# Patient Record
Sex: Female | Born: 1950 | Race: Black or African American | Hispanic: No | Marital: Married | Smoking: Never smoker
Health system: Southern US, Community
[De-identification: ages and names within clinical notes are randomized; demographics above are authoritative.]

## PROBLEM LIST (undated history)

## (undated) DIAGNOSIS — I10 Essential (primary) hypertension: Secondary | ICD-10-CM

## (undated) DIAGNOSIS — R51 Headache: Secondary | ICD-10-CM

## (undated) DIAGNOSIS — T783XXA Angioneurotic edema, initial encounter: Secondary | ICD-10-CM

## (undated) DIAGNOSIS — L509 Urticaria, unspecified: Secondary | ICD-10-CM

## (undated) DIAGNOSIS — K219 Gastro-esophageal reflux disease without esophagitis: Secondary | ICD-10-CM

## (undated) DIAGNOSIS — G971 Other reaction to spinal and lumbar puncture: Secondary | ICD-10-CM

## (undated) DIAGNOSIS — N189 Chronic kidney disease, unspecified: Secondary | ICD-10-CM

## (undated) HISTORY — PX: OTHER SURGICAL HISTORY: SHX169

## (undated) HISTORY — PX: TUBAL LIGATION: SHX77

## (undated) HISTORY — PX: DILATION AND CURETTAGE OF UTERUS: SHX78

## (undated) HISTORY — DX: Urticaria, unspecified: L50.9

## (undated) HISTORY — PX: TONSILLECTOMY: SUR1361

## (undated) HISTORY — DX: Essential (primary) hypertension: I10

## (undated) HISTORY — PX: BREAST SURGERY: SHX581

## (undated) HISTORY — DX: Angioneurotic edema, initial encounter: T78.3XXA

---

## 1998-07-23 ENCOUNTER — Other Ambulatory Visit: Admission: RE | Admit: 1998-07-23 | Discharge: 1998-07-23 | Payer: Self-pay | Admitting: Gynecology

## 2000-03-31 ENCOUNTER — Other Ambulatory Visit: Admission: RE | Admit: 2000-03-31 | Discharge: 2000-03-31 | Payer: Self-pay | Admitting: Gynecology

## 2001-08-12 ENCOUNTER — Other Ambulatory Visit: Admission: RE | Admit: 2001-08-12 | Discharge: 2001-08-12 | Payer: Self-pay | Admitting: Gynecology

## 2002-11-26 ENCOUNTER — Other Ambulatory Visit: Admission: RE | Admit: 2002-11-26 | Discharge: 2002-11-26 | Payer: Self-pay | Admitting: Gynecology

## 2003-04-29 ENCOUNTER — Encounter: Payer: Self-pay | Admitting: Emergency Medicine

## 2003-04-29 ENCOUNTER — Emergency Department (HOSPITAL_COMMUNITY): Admission: EM | Admit: 2003-04-29 | Discharge: 2003-04-29 | Payer: Self-pay | Admitting: Emergency Medicine

## 2004-06-23 ENCOUNTER — Encounter: Admission: RE | Admit: 2004-06-23 | Discharge: 2004-06-23 | Payer: Self-pay | Admitting: Obstetrics and Gynecology

## 2005-05-31 ENCOUNTER — Other Ambulatory Visit: Admission: RE | Admit: 2005-05-31 | Discharge: 2005-05-31 | Payer: Self-pay | Admitting: Gynecology

## 2006-04-13 ENCOUNTER — Encounter: Admission: RE | Admit: 2006-04-13 | Discharge: 2006-04-13 | Payer: Self-pay | Admitting: Gynecology

## 2007-05-25 ENCOUNTER — Ambulatory Visit (HOSPITAL_COMMUNITY): Admission: RE | Admit: 2007-05-25 | Discharge: 2007-05-25 | Payer: Self-pay | Admitting: Gynecology

## 2008-01-11 ENCOUNTER — Ambulatory Visit (HOSPITAL_COMMUNITY): Admission: RE | Admit: 2008-01-11 | Discharge: 2008-01-11 | Payer: Self-pay | Admitting: Family Medicine

## 2008-01-18 ENCOUNTER — Ambulatory Visit (HOSPITAL_COMMUNITY): Admission: RE | Admit: 2008-01-18 | Discharge: 2008-01-18 | Payer: Self-pay | Admitting: Gastroenterology

## 2008-01-18 ENCOUNTER — Ambulatory Visit: Payer: Self-pay | Admitting: Gastroenterology

## 2008-01-18 HISTORY — PX: COLONOSCOPY: SHX174

## 2008-03-04 ENCOUNTER — Ambulatory Visit: Payer: Self-pay | Admitting: Gastroenterology

## 2011-03-02 NOTE — Op Note (Signed)
Beth Mayo, Beth Mayo                  ACCOUNT NO.:  [id number]   MEDICAL RECORD NO.:  [id number]          PATIENT TYPE:  AMB   LOCATION:  DAY                           FACILITY:  [facility]   PHYSICIAN:  Kassie Mends, M.D.      DATE OF BIRTH:  1951-07-21   DATE OF PROCEDURE:  DATE OF DISCHARGE:                               OPERATIVE REPORT   REFERRING PHYSICIAN:  Scott A. Gerda Diss, MD   PROCEDURE:  Colonoscopy.   INDICATION FOR EXAMINATION:  Beth Mayo is a 60 year old female who  reports a change in her bowel habits.  She denies any rectal bleeding or  black tarry stools.  She is not having any abdominal pain.   FINDINGS:  Moderate internal hemorrhoids.  Otherwise no polyps, masses,  inflammatory changes, diverticular  AVMs.   RECOMMENDATIONS:  1. She should drink 6-8 cups of water daily and add Benefiber twice      daily.  She should follow a high fiber diet.  2. And MiraLax once daily.  3. She was given a handout on high-fiber diet and hemorrhoids.      Screening colonoscopy in 10 years.  4. Follow-up appointment in 4 weeks with Dr. Cira Servant regarding her      constipation.  5. Will check a TSH today.   MEDICATIONS:  1. Demerol 75 mg IV.  2. Versed 6 mg IV.   PROCEDURE TECHNIQUE:  Physical exam was performed.  Informed consent was  obtained from the patient after explaining the benefits, risks and  alternatives to the procedure.  The patient was connected to the monitor  and placed in the left lateral position.  Continuous oxygen was provided  by nasal cannula and IV medicine administered through  an indwelling  cannula.  After administration of sedation and rectal exam, the  patient's rectum was intubated  and the scope was advanced under direct visualization to the cecum.  The  scope was removed slowly by carefully examining the color, texture,  anatomy and integrity of the mucosa on the way out.  The patient was  recovered in endoscopy and discharged home in satisfactory  condition.      Kassie Mends, M.D.  Electronically Signed     SM/MEDQ  D:  01/18/2008  T:  01/18/2008  Job:  [id number]   cc:   Lorin Picket A. Gerda Diss, MD  Fax: [phone]

## 2011-03-02 NOTE — Assessment & Plan Note (Signed)
NAMELESSLY, STIGLER                   CHART#:  [id number]   DATE:  03/04/2008                       DOB:  [birth year]   PROBLEM LIST:  1. Constipation.  2. Hypertension.   SUBJECTIVE:  Ms. Daniely is a 60 year old female who was initially seen  for a screening colonoscopy.  She was complaining of change in her bowel  habits, and the colonoscopy revealed moderate internal hemorrhoids.  Since her visit in April 2009 she has added water, fiber, and MiraLax to  her regimen.  Her bowel movements are better.  She is now having 3-4 a  week.  Initially she had some straining, but now that is better.  She is  not seeing any blood.  She does not have any abdominal pain, heartburn  or indigestion.  She does have questions in regards to weight loss.   MEDICATIONS:  1. Verapamil.  2. Triamterene/HCTZ.  3. Benefiber usually daily.  4. MiraLax usually daily.   OBJECTIVE/PHYSICAL EXAMINATION:  VITAL SIGNS:  Weight 214 pounds, height  5 feet 4 inches, temperature 98.7, blood pressure 130/78, pulse 72.  GENERAL:  She is in no apparent distress.  Alert and oriented x4.  LUNGS:  Clear to auscultation bilaterally.  CARDIOVASCULAR:  Regular  rhythm.  No murmur.ABDOMEN:  Bowel sounds are present, soft, nontender,  nondistended.   ASSESSMENT:  Ms. Coltrane is a 60 year old female with constipation which  has responded to MiraLax, fiber, and water.  Thank you for allowing me  to see Ms. Macknight in consultation.   RECOMMENDATIONS:  1. I gave her a handout on low fat diet and if she continues to      exercise and has portion control and calorie control, then she      should be able to lose weight.  2. She should continue the water, fiber, and MiraLax.  3. She may follow with me as needed for her constipation.       Kassie Mends, M.D.  Electronically Signed     SM/MEDQ  D:  03/04/2008  T:  03/04/2008  Job:  [id number]   cc:   Lorin Picket A. Gerda Diss, MD

## 2011-07-13 LAB — TSH: TSH: 1.162

## 2012-11-28 ENCOUNTER — Telehealth: Payer: Self-pay | Admitting: Gastroenterology

## 2012-11-28 ENCOUNTER — Other Ambulatory Visit (HOSPITAL_COMMUNITY): Payer: Self-pay | Admitting: Family Medicine

## 2012-11-28 ENCOUNTER — Ambulatory Visit (HOSPITAL_COMMUNITY)
Admission: RE | Admit: 2012-11-28 | Discharge: 2012-11-28 | Disposition: A | Discharge status: Home / Self Care | Payer: BC Managed Care – PPO | Source: Ambulatory Visit | Attending: Family Medicine | Admitting: Family Medicine

## 2012-11-28 DIAGNOSIS — R109 Unspecified abdominal pain: Secondary | ICD-10-CM | POA: Insufficient documentation

## 2012-11-28 MED ORDER — IOHEXOL 300 MG/ML  SOLN
80.0000 mL | Freq: Once | INTRAMUSCULAR | Status: AC | PRN
  Administered 2012-11-28: 80 mL via INTRAVENOUS

## 2012-11-28 NOTE — Telephone Encounter (Signed)
Received call from Dr. Gerda Diss regarding patient. Epigastric pain X 3 days, known to our practice from the remote past.  SLF patient. CT negative, thus far labs negative.  I spoke with the patient, and she is able to see me tomorrow, 2/12 at 10 am.  Please arrange the appointment in epic, and can we request records from her PCP for her visit tomorrow? I know it is short notice. Thanks!

## 2012-11-29 ENCOUNTER — Ambulatory Visit (INDEPENDENT_AMBULATORY_CARE_PROVIDER_SITE_OTHER): Payer: BC Managed Care – PPO | Admitting: Gastroenterology

## 2012-11-29 ENCOUNTER — Encounter: Payer: Self-pay | Admitting: Gastroenterology

## 2012-11-29 VITALS — BP 137/76 | HR 74 | Temp 98.4°F | Ht 64.0 in | Wt 220.2 lb

## 2012-11-29 DIAGNOSIS — K3189 Other diseases of stomach and duodenum: Secondary | ICD-10-CM

## 2012-11-29 DIAGNOSIS — R1013 Epigastric pain: Secondary | ICD-10-CM

## 2012-11-29 NOTE — Patient Instructions (Addendum)
Continue taking Protonix each morning, 30 minutes before breakfast.  We have set you up for an upper endoscopy with Dr. Darrick Penna in the near future.  STOP BC powders :)   Please review the low-fat diet. This is good to follow due to the history of a "fatty liver".   Fat and Cholesterol Control Diet Cholesterol levels in your body are determined significantly by your diet. Cholesterol levels may also be related to heart disease. The following material helps to explain this relationship and discusses what you can do to help keep your heart healthy. Not all cholesterol is bad. Low-density lipoprotein (LDL) cholesterol is the "bad" cholesterol. It may cause fatty deposits to build up inside your arteries. High-density lipoprotein (HDL) cholesterol is "good." It helps to remove the "bad" LDL cholesterol from your blood. Cholesterol is a very important risk factor for heart disease. Other risk factors are high blood pressure, smoking, stress, heredity, and weight. The heart muscle gets its supply of blood through the coronary arteries. If your LDL cholesterol is high and your HDL cholesterol is low, you are at risk for having fatty deposits build up in your coronary arteries. This leaves less room through which blood can flow. Without sufficient blood and oxygen, the heart muscle cannot function properly and you may feel chest pains (angina pectoris). When a coronary artery closes up entirely, a part of the heart muscle may die causing a heart attack (myocardial infarction). CHECKING CHOLESTEROL When your caregiver sends your blood to a lab to be examined for cholesterol, a complete lipid (fat) profile may be done. With this test, the total amount of cholesterol and levels of LDL and HDL are determined. Triglycerides are a type of fat that circulates in the blood. They can also be used to determine heart disease risk. The list below describes what the numbers should be: Test: Total Cholesterol.  Less than 200  mg/dl. Test: LDL "bad cholesterol."  Less than 100 mg/dl.  Less than 70 mg/dl if you are at very high risk of a heart attack or sudden cardiac death. Test: HDL "good cholesterol."  Greater than 50 mg/dl for women.  Greater than 40 mg/dl for men. Test: Triglycerides.  Less than 150 mg/dl. CONTROLLING CHOLESTEROL WITH DIET Although exercise and lifestyle factors are important, your diet is key. That is because certain foods are known to raise cholesterol and others to lower it. The goal is to balance foods for their effect on cholesterol and more importantly, to replace saturated and trans fat with other types of fat, such as monounsaturated fat, polyunsaturated fat, and omega-3 fatty acids. On average, a person should consume no more than 15 to 17 g of saturated fat daily. Saturated and trans fats are considered "bad" fats, and they will raise LDL cholesterol. Saturated fats are primarily found in animal products such as meats, butter, and cream. However, that does not mean you need to give up all your favorite foods. Today, there are good tasting, low-fat, low-cholesterol substitutes for most of the things you like to eat. Choose low-fat or nonfat alternatives. Choose round or loin cuts of red meat. These types of cuts are lowest in fat and cholesterol. Chicken (without the skin), fish, veal, and ground [place] breast are great choices. Eliminate fatty meats, such as hot dogs and salami. Even shellfish have little or no saturated fat. Have a 3 oz (85 g) portion when you eat lean meat, poultry, or fish. Trans fats are also called "partially hydrogenated oils." They are  oils that have been scientifically manipulated so that they are solid at room temperature resulting in a longer shelf life and improved taste and texture of foods in which they are added. Trans fats are found in stick margarine, some tub margarines, cookies, crackers, and baked goods.  When baking and cooking, oils are a great  substitute for butter. The monounsaturated oils are especially beneficial since it is believed they lower LDL and raise HDL. The oils you should avoid entirely are saturated tropical oils, such as coconut and palm.  Remember to eat a lot from food groups that are naturally free of saturated and trans fat, including fish, fruit, vegetables, beans, grains (barley, rice, couscous, bulgur wheat), and pasta (without cream sauces).  IDENTIFYING FOODS THAT LOWER CHOLESTEROL  Soluble fiber may lower your cholesterol. This type of fiber is found in fruits such as apples, vegetables such as broccoli, potatoes, and carrots, legumes such as beans, peas, and lentils, and grains such as barley. Foods fortified with plant sterols (phytosterol) may also lower cholesterol. You should eat at least 2 g per day of these foods for a cholesterol lowering effect.  Read package labels to identify low-saturated fats, trans fat free, and low-fat foods at the supermarket. Select cheeses that have only 2 to 3 g saturated fat per ounce. Use a heart-healthy tub margarine that is free of trans fats or partially hydrogenated oil. When buying baked goods (cookies, crackers), avoid partially hydrogenated oils. Breads and muffins should be made from whole grains (whole-wheat or whole oat flour, instead of "flour" or "enriched flour"). Buy non-creamy canned soups with reduced salt and no added fats.  FOOD PREPARATION TECHNIQUES  Never deep-fry. If you must fry, either stir-fry, which uses very little fat, or use non-stick cooking sprays. When possible, broil, bake, or roast meats, and steam vegetables. Instead of putting butter or margarine on vegetables, use lemon and herbs, applesauce, and cinnamon (for squash and sweet potatoes), nonfat yogurt, salsa, and low-fat dressings for salads.  LOW-SATURATED FAT / LOW-FAT FOOD SUBSTITUTES Meats / Saturated Fat (g)  Avoid: Steak, marbled (3 oz/85 g) / 11 g  Choose: Steak, lean (3 oz/85 g) / 4  g  Avoid: Hamburger (3 oz/85 g) / 7 g  Choose: Hamburger, lean (3 oz/85 g) / 5 g  Avoid: Ham (3 oz/85 g) / 6 g  Choose: Ham, lean cut (3 oz/85 g) / 2.4 g  Avoid: Chicken, with skin, dark meat (3 oz/85 g) / 4 g  Choose: Chicken, skin removed, dark meat (3 oz/85 g) / 2 g  Avoid: Chicken, with skin, light meat (3 oz/85 g) / 2.5 g  Choose: Chicken, skin removed, light meat (3 oz/85 g) / 1 g Dairy / Saturated Fat (g)  Avoid: Whole milk (1 cup) / 5 g  Choose: Low-fat milk, 2% (1 cup) / 3 g  Choose: Low-fat milk, 1% (1 cup) / 1.5 g  Choose: Skim milk (1 cup) / 0.3 g  Avoid: Hard cheese (1 oz/28 g) / 6 g  Choose: Skim milk cheese (1 oz/28 g) / 2 to 3 g  Avoid: Cottage cheese, 4% fat (1 cup) / 6.5 g  Choose: Low-fat cottage cheese, 1% fat (1 cup) / 1.5 g  Avoid: Ice cream (1 cup) / 9 g  Choose: Sherbet (1 cup) / 2.5 g  Choose: Nonfat frozen yogurt (1 cup) / 0.3 g  Choose: Frozen fruit bar / trace  Avoid: Whipped cream (1 tbs) / 3.5 g  Choose:  Nondairy whipped topping (1 tbs) / 1 g Condiments / Saturated Fat (g)  Avoid: Mayonnaise (1 tbs) / 2 g  Choose: Low-fat mayonnaise (1 tbs) / 1 g  Avoid: Butter (1 tbs) / 7 g  Choose: Extra light margarine (1 tbs) / 1 g  Avoid: Coconut oil (1 tbs) / 11.8 g  Choose: Olive oil (1 tbs) / 1.8 g  Choose: Corn oil (1 tbs) / 1.7 g  Choose: Safflower oil (1 tbs) / 1.2 g  Choose: Sunflower oil (1 tbs) / 1.4 g  Choose: Soybean oil (1 tbs) / 2.4 g  Choose: Canola oil (1 tbs) / 1 g Document Released: 10/04/2005 Document Revised: 12/27/2011 Document Reviewed: 03/25/2011 [facility] Patient Information 2013 [place], [place].

## 2012-11-29 NOTE — Progress Notes (Signed)
Referring Provider: Babs Sciara, MD Primary Care Physician:  Lilyan Punt, MD Primary Gastroenterologist:  Dr. Darrick Penna   Chief Complaint  Patient presents with  . Abdominal Pain    HPI:   [name] is a pleasant 62 year old female who presents today as an urgent office visit at the request of Dr. Lilyan Punt, secondary to new onset dyspepsia. Labs including LFTs, CBC, BMP, amylase, and lipase have all been normal. CT performed with hepatic steatosis, otherwise normal.  Notes acute onset of epigastric pain last Saturday. Noted as constant, sometimes worse than others. Not worsened by eating/drinking. Started Protonix yesterday and noted some relief. Taking Mylanta prn. No N/V. Denies reflux. Denies dysphagia. No wt loss or lack of appetite. Denies melena. Ibuprofen rarely. +BC powders, routinely. Scant hematochezia with constipation. Has taken probiotics in the past with some improvement in constipation. No probiotics since Saturday. Last colonoscopy in April 2009 overall normal by Dr. Darrick Penna, repeat in 10 years.    Past Medical History  Diagnosis Date  . Hypertension     Past Surgical History  Procedure Laterality Date  . Colonoscopy  01/18/2008      SLF: Moderate internal hemorrhoids.Otherwise no polyps, masses inflammatory changes, diverticular  AVMs.  . Cesarean section      X2  . Tonsillectomy      Current Outpatient Prescriptions  Medication Sig Dispense Refill  . pantoprazole (PROTONIX) 40 MG tablet Take 40 mg by mouth daily.       Marland Kitchen triamterene-hydrochlorothiazide (DYAZIDE) 37.5-25 MG per capsule Take 1 capsule by mouth daily.       . verapamil (CALAN-SR) 240 MG CR tablet Take 240 mg by mouth at bedtime.        No current facility-administered medications for this visit.    Allergies as of 11/29/2012 - Review Complete 11/29/2012  Allergen Reaction Noted  . Codeine Other (See Comments) 11/29/2012    Family History  Problem Relation Age of Onset  . Colon cancer  Neg Hx     History   Social History  . Marital Status: Married    Spouse Name: N/A    Number of Children: N/A  . Years of Education: N/A   Occupational History  . 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 Smoking status: Never Smoker   . Smokeless tobacco: Not on file  . Alcohol Use: Yes     Comment: socially  . Drug Use: No  . Sexually Active: Not on file   Other Topics Concern  . Not on file   Social History Narrative  . No narrative on file    Review of Systems: Gen: Denies any fever, chills, loss of appetite, fatigue, weight loss. CV: Denies chest pain, heart palpitations, syncope, peripheral edema. Resp: Denies shortness of breath with rest, cough, wheezing GI: SEE HPI GU : Denies urinary burning, urinary frequency, urinary incontinence.  MS: +back pain Derm: Denies rash, itching, dry skin Psych: Denies depression, anxiety, confusion or memory loss  Heme: Denies bruising, bleeding, and enlarged lymph nodes.  Physical Exam: BP 137/76  Pulse 74  Temp(Src) 98.4 F (36.9 C) (Oral)  Ht 5\' 4"  (1.626 m)  Wt 220 lb 3.2 oz (99.882 kg)  BMI 37.78 kg/m2 General:   Alert and oriented. Well-developed, well-nourished, pleasant and cooperative. Head:  Normocephalic and atraumatic. Eyes:  Conjunctiva pink, sclera clear, no icterus.   Conjunctiva pink. Ears:  Normal auditory acuity. Nose:  No deformity, discharge,  or lesions. Mouth:  No deformity or lesions, mucosa pink and moist.  Neck:  Supple, without mass or thyromegaly. Lungs:  Clear to auscultation bilaterally, without wheezing, rales, or rhonchi.  Heart:  S1, S2 present without murmurs noted.  Abdomen:  +BS, soft, TTP epigastric region and non-distended. Without mass or HSM. No rebound or guarding. No hernias noted. Rectal:  Deferred  Msk:  Symmetrical without gross deformities. Normal posture. Extremities:  Without clubbing or edema. Neurologic:  Alert and  oriented x4;   grossly normal neurologically. Skin:  Intact, warm and dry without significant lesions or rashes Cervical Nodes:  No significant cervical adenopathy. Psych:  Alert and cooperative. Normal mood and affect.

## 2012-11-29 NOTE — Telephone Encounter (Signed)
All set!

## 2012-11-30 DIAGNOSIS — R1013 Epigastric pain: Secondary | ICD-10-CM | POA: Insufficient documentation

## 2012-11-30 NOTE — Assessment & Plan Note (Signed)
62 year old female with new-onset epigastric pain in the setting of daily BC powders. She denies any melena, and CBC, BMP, HFP, lipase, amylase, and CT are all normal. Incidental finding of fatty liver noted. She has noted mild improvement with the introduction of Protonix yesterday. Likely dealing with gastritis, PUD secondary to [facility] powders. Stop all NSAIDs, aspirin powders. Continue Protonix.  Proceed with upper endoscopy in the near future with Dr. Darrick Penna. The risks, benefits, and alternatives have been discussed in detail with patient. They have stated understanding and desire to proceed.

## 2012-12-01 ENCOUNTER — Ambulatory Visit (HOSPITAL_COMMUNITY): Payer: Self-pay

## 2012-12-04 ENCOUNTER — Encounter (HOSPITAL_COMMUNITY): Payer: Self-pay | Admitting: *Deleted

## 2012-12-04 ENCOUNTER — Encounter (HOSPITAL_COMMUNITY)
Admission: RE | Disposition: A | Discharge status: Home / Self Care | Payer: Self-pay | Source: Ambulatory Visit | Attending: Gastroenterology

## 2012-12-04 ENCOUNTER — Ambulatory Visit (HOSPITAL_COMMUNITY)
Admission: RE | Admit: 2012-12-04 | Discharge: 2012-12-04 | Disposition: A | Discharge status: Home / Self Care | Payer: BC Managed Care – PPO | Source: Ambulatory Visit | Attending: Gastroenterology | Admitting: Gastroenterology

## 2012-12-04 DIAGNOSIS — R1310 Dysphagia, unspecified: Secondary | ICD-10-CM | POA: Insufficient documentation

## 2012-12-04 DIAGNOSIS — K2960 Other gastritis without bleeding: Secondary | ICD-10-CM

## 2012-12-04 DIAGNOSIS — K222 Esophageal obstruction: Secondary | ICD-10-CM | POA: Insufficient documentation

## 2012-12-04 DIAGNOSIS — R1013 Epigastric pain: Secondary | ICD-10-CM

## 2012-12-04 DIAGNOSIS — K2980 Duodenitis without bleeding: Secondary | ICD-10-CM | POA: Insufficient documentation

## 2012-12-04 DIAGNOSIS — I10 Essential (primary) hypertension: Secondary | ICD-10-CM | POA: Insufficient documentation

## 2012-12-04 DIAGNOSIS — K2940 Chronic atrophic gastritis without bleeding: Secondary | ICD-10-CM | POA: Insufficient documentation

## 2012-12-04 DIAGNOSIS — K3189 Other diseases of stomach and duodenum: Secondary | ICD-10-CM

## 2012-12-04 HISTORY — DX: Chronic kidney disease, unspecified: N18.9

## 2012-12-04 HISTORY — DX: Headache: R51

## 2012-12-04 HISTORY — PX: ESOPHAGOGASTRODUODENOSCOPY: SHX5428

## 2012-12-04 HISTORY — DX: Gastro-esophageal reflux disease without esophagitis: K21.9

## 2012-12-04 HISTORY — DX: Other reaction to spinal and lumbar puncture: G97.1

## 2012-12-04 SURGERY — EGD (ESOPHAGOGASTRODUODENOSCOPY)
Anesthesia: Moderate Sedation

## 2012-12-04 MED ORDER — MIDAZOLAM HCL 5 MG/5ML IJ SOLN
INTRAMUSCULAR | Status: DC | PRN
  Administered 2012-12-04 (×2): 2 mg via INTRAVENOUS

## 2012-12-04 MED ORDER — MEPERIDINE HCL 100 MG/ML IJ SOLN
INTRAMUSCULAR | Status: AC
  Filled 2012-12-04: qty 1

## 2012-12-04 MED ORDER — SODIUM CHLORIDE 0.45 % IV SOLN
INTRAVENOUS | Status: DC
  Administered 2012-12-04: 1000 mL via INTRAVENOUS

## 2012-12-04 MED ORDER — MEPERIDINE HCL 100 MG/ML IJ SOLN
INTRAMUSCULAR | Status: DC | PRN
  Administered 2012-12-04 (×2): 25 mg via INTRAVENOUS

## 2012-12-04 MED ORDER — MIDAZOLAM HCL 5 MG/5ML IJ SOLN
INTRAMUSCULAR | Status: AC
  Filled 2012-12-04: qty 10

## 2012-12-04 MED ORDER — PANTOPRAZOLE SODIUM 40 MG PO TBEC: DELAYED_RELEASE_TABLET | ORAL | Status: DC

## 2012-12-04 MED ORDER — STERILE WATER FOR IRRIGATION IR SOLN
Status: DC | PRN
  Administered 2012-12-04: 14:00:00

## 2012-12-04 MED ORDER — BUTAMBEN-TETRACAINE-BENZOCAINE 2-2-14 % EX AERO
INHALATION_SPRAY | CUTANEOUS | Status: DC | PRN
  Administered 2012-12-04: 2 via TOPICAL

## 2012-12-04 NOTE — Progress Notes (Signed)
Faxed to PCP

## 2012-12-04 NOTE — Op Note (Signed)
[facility] [address] [place], [address]   ENDOSCOPY PROCEDURE REPORT  PATIENT: Beth Mayo, Yearick  MR#: [id number] BIRTHDATE: [birth year] , 61  yrs. old GENDER: Female  ENDOSCOPIST: Jonette Eva, MD REFERRED XB:JYNWG Gerda Diss, M.D. PROCEDURE DATE: 12/04/2012 PROCEDURE:   EGD w/ biopsy  INDICATIONS:Epigastric pain WORSE WITH FOOD. USING BC POWDERS. LAST DOSE WED. MEDICATIONS: Demerol 50 mg IV and Versed 4 mg IV TOPICAL ANESTHETIC:   Cetacaine Spray  DESCRIPTION OF PROCEDURE:     Physical exam was performed.  Informed consent was obtained from the patient after explaining the benefits, risks, and alternatives to the procedure.  The patient was connected to the monitor and placed in the left lateral position.  Continuous oxygen was provided by nasal cannula and IV medicine administered through an indwelling cannula.  After administration of sedation, the patients esophagus was intubated and the EG-[id number] ([id number])  endoscope was advanced under direct visualization to the second portion of the duodenum.  The scope was removed slowly by carefully examining the color, texture, anatomy, and integrity of the mucosa on the way out.  The patient was recovered in endoscopy and discharged home in satisfactory condition.   ESOPHAGUS: A mildly severe Schatzki ring was found at the gastroesophageal junction.   NO BARRETT'S. A small hiatal hernia was noted.    STOMACH: Moderate erosive gastritis (inflammation) was found in the gastric antrum.  Multiple biopsies were performed using cold forceps.  DUODENUM: Moderate duodenal inflammation was found in the bulb and second portion of the duodenum.   The duodenal mucosa showed no abnormalities in the ampulla.  COMPLICATIONS:   None  ENDOSCOPIC IMPRESSION: 1.   Schatzki ring was found at the gastroesophageal junction 2.   Small hiatal hernia 3.   MODERATE Erosive gastritis (inflammation) in the gastric antrum 4.   MILD  DUODENITIS in the bulb and second portion of the duodenum 5.   NEW ONSET DYSPEPSIA DUE TO NSAID INDUCED GASTRITIS/DUODENITIS  RECOMMENDATIONS: AWAIT BIOPSY AVOID ASA/NSAIDS FOR 1 MO BID PPI OPV JUNE 2014   REPEAT EXAM:   _______________________________ Rosalie DoctorJonette Eva, MD 12/04/2012 2:16 PM       PATIENT NAME:  Beth Mayo, Beth Mayo MR#: [id number]

## 2012-12-04 NOTE — Discharge Instructions (Signed)
You have gastritis & DUODENITIS DUE TO YOUR USING BC powders. I biopsied your stomach. You have an esophageal ring and a small hiatal hernia.   CONTINUE PROTONIX. TAKE 30 MINUTES PRIOR TO MEALS TWICE DAILY for 3 MOS THEN ONCE DAILY.  NO ASA, BC GOODY'S, ALEVE/naproxen, OR IBUPROFEN/ADVIL FOR ONE MONTH.  FOLLOW A LOW FAT DIET. SEE INFO BELOW.  LOSE 10 LBS.  YOUR BIOPSY WILL BE BACK IN 7 DAYS.  FOLLOW UP JUNE 2014.   UPPER ENDOSCOPY AFTER CARE Read the instructions outlined below and refer to this sheet in the next week. These discharge instructions provide you with general information on caring for yourself after you leave the hospital. While your treatment has been planned according to the most current medical practices available, unavoidable complications occasionally occur. If you have any problems or questions after discharge, call DR. Jaritza Duignan, [phone].  ACTIVITY  You may resume your regular activity, but move at a slower pace for the next 24 hours.   Take frequent rest periods for the next 24 hours.   Walking will help get rid of the air and reduce the bloated feeling in your belly (abdomen).   No driving for 24 hours (because of the medicine (anesthesia) used during the test).   You may shower.   Do not sign any important legal documents or operate any machinery for 24 hours (because of the anesthesia used during the test).    NUTRITION  Drink plenty of fluids.   You may resume your normal diet as instructed by your doctor.   Begin with a light meal and progress to your normal diet. Heavy or fried foods are harder to digest and may make you feel sick to your stomach (nauseated).   Avoid alcoholic beverages for 24 hours or as instructed.    MEDICATIONS  You may resume your normal medications.   WHAT YOU CAN EXPECT TODAY  Some feelings of bloating in the abdomen.   Passage of more gas than usual.    IF YOU HAD A BIOPSY TAKEN DURING THE UPPER  ENDOSCOPY:  Eat a soft diet IF YOU HAVE NAUSEA, BLOATING, ABDOMINAL PAIN, OR VOMITING.    FINDING OUT THE RESULTS OF YOUR TEST Not all test results are available during your visit. DR. Darrick Penna WILL CALL YOU WITHIN 7 DAYS OF YOUR PROCEDUE WITH YOUR RESULTS. Do not assume everything is normal if you have not heard from DR. Misha Vanoverbeke IN ONE WEEK, CALL HER OFFICE AT [phone].  SEEK IMMEDIATE MEDICAL ATTENTION AND CALL THE OFFICE: [phone] IF:  You have more than a spotting of blood in your stool.   Your belly is swollen (abdominal distention).   You are nauseated or vomiting.   You have a temperature over 101F.   You have abdominal pain or discomfort that is severe or gets worse throughout the day.   Gastritis/DUODENITIS  Gastritis is an inflammation (the body's way of reacting to injury and/or infection) of the stomach. DUODENITIS is an inflammation (the body's way of reacting to injury and/or infection) of the FIRST PART OF THE SMALL INTESTINES. It is often caused by bacterial (germ) infections. It can also be caused BY ASPIRIN, BC/GOODY POWDER'S, (IBUPROFEN) MOTRIN, OR ALEVE (NAPROXEN), chemicals (including alcohol), SPICY FOODS, and medications. This illness may be associated with generalized malaise (feeling tired, not well), UPPER ABDOMINAL STOMACH cramps, and fever. One common bacterial cause of gastritis is an organism known as H. Pylori. This can be treated with antibiotics.     REFLUX  TREATMENT There are a number of non-prescription medicines used to treat reflux including: Antacids.  ZANTAC Proton-pump inhibitors: PRILOSEC OR NEXIUM  HOME CARE INSTRUCTIONS Eat 2-3 hours before going to bed.  Try to reach and maintain a healthy weight. LOSE 10-20 LBS Do not eat just a few very large meals. Instead, eat 4 TO 6 smaller meals throughout the day.  Try to identify foods and beverages that make your symptoms worse, and avoid these.  Avoid tight clothing.  Do not  exercise right after eating.   Low-Fat Diet BREADS, CEREALS, PASTA, RICE, DRIED PEAS, AND BEANS These products are high in carbohydrates and most are low in fat. Therefore, they can be increased in the diet as substitutes for fatty foods. They too, however, contain calories and should not be eaten in excess. Cereals can be eaten for snacks as well as for breakfast.  Include foods that contain fiber (fruits, vegetables, whole grains, and legumes). Research shows that fiber may lower blood cholesterol levels, especially the water-soluble fiber found in fruits, vegetables, oat products, and legumes. FRUITS AND VEGETABLES It is good to eat fruits and vegetables. Besides being sources of fiber, both are rich in vitamins and some minerals. They help you get the daily allowances of these nutrients. Fruits and vegetables can be used for snacks and desserts. MEATS Limit lean meat, chicken, [place], and fish to no more than 6 ounces per day. Beef, Pork, and Lamb Use lean cuts of beef, pork, and lamb. Lean cuts include:  Extra-lean ground beef.  Arm roast.  Sirloin tip.  Center-cut ham.  Round steak.  Loin chops.  Rump roast.  Tenderloin.  Trim all fat off the outside of meats before cooking. It is not necessary to severely decrease the intake of red meat, but lean choices should be made. Lean meat is rich in protein and contains a highly absorbable form of iron. Premenopausal women, in particular, should avoid reducing lean red meat because this could increase the risk for low red blood cells (iron-deficiency anemia).  Chicken and [place] These are good sources of protein. The fat of poultry can be reduced by removing the skin and underlying fat layers before cooking. Chicken and [place] can be substituted for lean red meat in the diet. Poultry should not be fried or covered with high-fat sauces. Fish and Shellfish Fish is a good source of protein. Shellfish contain cholesterol, but they usually are low  in saturated fatty acids. The preparation of fish is important. Like chicken and [place], they should not be fried or covered with high-fat sauces. EGGS Egg whites contain no fat or cholesterol. They can be eaten often. Try 1 to 2 egg whites instead of whole eggs in recipes or use egg substitutes that do not contain yolk.  MILK AND DAIRY PRODUCTS Use skim or 1% milk instead of 2% or whole milk. Decrease whole milk, natural, and processed cheeses. Use nonfat or low-fat (2%) cottage cheese or low-fat cheeses made from vegetable oils. Choose nonfat or low-fat (1 to 2%) yogurt. Experiment with evaporated skim milk in recipes that call for heavy cream. Substitute low-fat yogurt or low-fat cottage cheese for sour cream in dips and salad dressings. Have at least 2 servings of low-fat dairy products, such as 2 glasses of skim (or 1%) milk each day to help get your daily calcium intake.  FATS AND OILS Butterfat, lard, and beef fats are high in saturated fat and cholesterol. These should be avoided.Vegetable fats do not contain cholesterol. AVOID  coconut oil, palm oil, and palm kernel oil, WHICH are very high in saturated fats. These should be limited. These fats are often used in bakery goods, processed foods, popcorn, oils, and nondairy creamers. Vegetable shortenings and some peanut butters contain hydrogenated oils, which are also saturated fats. Read the labels on these foods and check for saturated vegetable oils.  Desirable liquid vegetable oils are corn oil, cottonseed oil, olive oil, canola oil, safflower oil, soybean oil, and sunflower oil. Peanut oil is not as good, but small amounts are acceptable. Buy a heart-healthy tub margarine that has no partially hydrogenated oils in the ingredients. AVOID Mayonnaise and salad dressings often are made from unsaturated fats.  OTHER EATING TIPS Snacks  Most sweets should be limited as snacks. They tend to be rich in calories and fats, and their caloric content  outweighs their nutritional value. Some good choices in snacks are graham crackers, melba toast, soda crackers, bagels (no egg), English muffins, fruits, and vegetables. These snacks are preferable to snack crackers, [place] fries, and chips. Popcorn should be air-popped or cooked in small amounts of liquid vegetable oil.  Desserts Eat fruit, low-fat yogurt, and fruit ices instead of pastries, cake, and cookies. Sherbet, angel food cake, gelatin dessert, frozen low-fat yogurt, or other frozen products that do not contain saturated fat (pure fruit juice bars, frozen ice pops) are also acceptable.   COOKING METHODS Choose those methods that use little or no fat. They include: Poaching.  Braising.  Steaming.  Grilling.  Baking.  Stir-frying.  Broiling.  Microwaving.  Foods can be cooked in a nonstick pan without added fat, or use a nonfat cooking spray in regular cookware. Limit fried foods and avoid frying in saturated fat. Add moisture to lean meats by using water, broth, cooking wines, and other nonfat or low-fat sauces along with the cooking methods mentioned above. Soups and stews should be chilled after cooking. The fat that forms on top after a few hours in the refrigerator should be skimmed off. When preparing meals, avoid using excess salt. Salt can contribute to raising blood pressure in some people.  EATING AWAY FROM HOME Order entres, potatoes, and vegetables without sauces or butter. When meat exceeds the size of a deck of cards (3 to 4 ounces), the rest can be taken home for another meal. Choose vegetable or fruit salads and ask for low-calorie salad dressings to be served on the side. Use dressings sparingly. Limit high-fat toppings, such as bacon, crumbled eggs, cheese, sunflower seeds, and olives. Ask for heart-healthy tub margarine instead of butter.

## 2012-12-04 NOTE — H&P (Signed)
  Primary Care Physician:  Lilyan Punt, MD Primary Gastroenterologist:  Dr. Darrick Penna  Pre-Procedure History & Physical: HPI:  Beth Mayo is a 62 y.o. female here for new-onset DYSPEPSIA.   Past Medical History  Diagnosis Date  . Hypertension   . Chronic kidney disease     kidney stones  . GERD (gastroesophageal reflux disease)   . Headache     migraines  . Spinal headache     Past Surgical History  Procedure Laterality Date  . Colonoscopy  01/18/2008      SLF: Moderate internal hemorrhoids.Otherwise no polyps, masses inflammatory changes, diverticular  AVMs.  . Cesarean section      X2  . Tonsillectomy    . Rotator cuff right Right   . Breast surgery      breast reduction  . Dilation and curettage of uterus      for miscarriage  . Tubal ligation      Prior to Admission medications   Medication Sig Start Date End Date Taking? Authorizing Provider  pantoprazole (PROTONIX) 40 MG tablet Take 40 mg by mouth daily.  11/28/12  Yes Historical Provider, MD  triamterene-hydrochlorothiazide (DYAZIDE) 37.5-25 MG per capsule Take 1 capsule by mouth daily.  10/31/12  Yes Historical Provider, MD  verapamil (CALAN-SR) 240 MG CR tablet Take 240 mg by mouth at bedtime.  11/27/12  Yes Historical Provider, MD    Allergies as of 11/29/2012 - Review Complete 11/29/2012  Allergen Reaction Noted  . Codeine Other (See Comments) 11/29/2012    Family History  Problem Relation Age of Onset  . Colon cancer Neg Hx     History   Social History  . Marital Status: Married    Spouse Name: N/A    Number of Children: N/A  . Years of Education: N/A   Occupational History  . 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 Smoking status: Never Smoker   . Smokeless tobacco: Not on file  . Alcohol Use: Yes     Comment: socially  . Drug Use: No  . Sexually Active: Not on file   Other Topics Concern  . Not on file   Social History Narrative  . No narrative  on file    Review of Systems: See HPI, otherwise negative ROS   Physical Exam: BP 153/82  Pulse 67  Temp(Src) 97.6 F (36.4 C) (Oral)  Resp 20  Ht 5\' 4"  (1.626 m)  Wt 220 lb (99.791 kg)  BMI 37.74 kg/m2  SpO2 98% General:   Alert,  pleasant and cooperative in NAD Head:  Normocephalic and atraumatic. Neck:  Supple; Lungs:  Clear throughout to auscultation.    Heart:  Regular rate and rhythm. Abdomen:  Soft, nontender and nondistended. Normal bowel sounds, without guarding, and without rebound.   Neurologic:  Alert and  oriented x4;  grossly normal neurologically.  Impression/Plan:     DYSPEPSIA  PLAN:  EGD TODAY

## 2012-12-07 ENCOUNTER — Encounter (HOSPITAL_COMMUNITY): Payer: Self-pay | Admitting: Gastroenterology

## 2012-12-08 ENCOUNTER — Telehealth: Payer: Self-pay | Admitting: Gastroenterology

## 2012-12-08 NOTE — Telephone Encounter (Signed)
Please call pt. HER stomach Bx shows gastritis.    NO ASA, BC GOODY'S, ALEVE/naproxen, OR IBUPROFEN/ADVIL FOR ONE MONTH.  FOLLOW A LOW FAT DIET.   LOSE 10 LBS.  FOLLOW UP JUNE 2014 W/ SF-DYSPEPSIA.

## 2012-12-11 NOTE — Telephone Encounter (Signed)
LMOM to call.

## 2012-12-12 NOTE — Telephone Encounter (Signed)
Path faxed to PCP, recall made 

## 2012-12-12 NOTE — Telephone Encounter (Signed)
Called and informed pt.  

## 2013-02-01 ENCOUNTER — Telehealth: Payer: Self-pay

## 2013-02-01 MED ORDER — ESOMEPRAZOLE MAGNESIUM 40 MG PO PACK: 40.0000 mg | PACK | Freq: Every day | ORAL | Status: DC

## 2013-02-01 NOTE — Telephone Encounter (Signed)
Tried to call pt- LM with details and asked her to call us with an update.

## 2013-02-01 NOTE — Telephone Encounter (Signed)
I have sent in Nexium with one refill.  Have patient call us with an update when this is done.

## 2013-02-01 NOTE — Telephone Encounter (Signed)
Received PA request from pharmacy for protonix bid. pts insurance refused because pt has not tried and failed omeprazole and nexium. Spoke with pt yesterday, she has been out of protonix x 2 days and is starting to notice some reflux and is willing to try either omeprazole or nexium. Please advise.

## 2013-02-15 ENCOUNTER — Ambulatory Visit (INDEPENDENT_AMBULATORY_CARE_PROVIDER_SITE_OTHER): Payer: BC Managed Care – PPO | Admitting: Family Medicine

## 2013-02-15 ENCOUNTER — Encounter: Payer: Self-pay | Admitting: Family Medicine

## 2013-02-15 VITALS — BP 138/80 | Temp 98.5°F | Ht 64.0 in | Wt 219.2 lb

## 2013-02-15 DIAGNOSIS — J019 Acute sinusitis, unspecified: Secondary | ICD-10-CM

## 2013-02-15 DIAGNOSIS — J45909 Unspecified asthma, uncomplicated: Secondary | ICD-10-CM

## 2013-02-15 MED ORDER — PREDNISONE 20 MG PO TABS: ORAL_TABLET | ORAL | Status: DC

## 2013-02-15 MED ORDER — AZITHROMYCIN 250 MG PO TABS: ORAL_TABLET | ORAL | Status: DC

## 2013-02-15 MED ORDER — ALBUTEROL SULFATE HFA 108 (90 BASE) MCG/ACT IN AERS: 2.0000 | INHALATION_SPRAY | Freq: Four times a day (QID) | RESPIRATORY_TRACT | Status: DC | PRN

## 2013-02-15 MED ORDER — BENZONATATE 100 MG PO CAPS: 100.0000 mg | ORAL_CAPSULE | Freq: Three times a day (TID) | ORAL | Status: DC | PRN

## 2013-02-15 NOTE — Progress Notes (Signed)
  Subjective:    Patient ID: Beth Mayo, female    DOB: [birth year], 62 y.o.   MRN: [id number]  Cough This is a new problem. The current episode started in the past 7 days. The problem has been unchanged. The problem occurs constantly. The cough is non-productive. Associated symptoms include headaches and a sore throat. Nothing aggravates the symptoms. She has tried nothing for the symptoms. The treatment provided no relief.   Patient denies shortness of breath but she did notice some wheezing last night cough kept her awake. She denies high fever chills she denies hemoptysis she does state she coughs up some phlegm no severe ear pain. Some sore throat and some cough noted. Past medical history benign. Patient doesn't smoke.   Review of Systems  HENT: Positive for sore throat.   Respiratory: Positive for cough.   Neurological: Positive for headaches.       Objective:   Physical Exam Eardrums normal, throat normal neck no masses lungs wheeze is noted but not in severe respiratory distressr no crackles heart regular pulse normal       Assessment & Plan:  Reactive airway-probably secondary to upper respiratory illness/viral illness. Also some resulting acute sinusitis. I recommend prednisone taper over the next 6 days also recommend Ventolin 2 puffs every 4 hours when necessary plusZ-Pak. Followup if ongoing troubles. Warnings discussed.

## 2013-03-22 NOTE — Progress Notes (Signed)
EGD FEB 2014 GASTRITIS  REVIEWED.  E15 OPV IN JUN OR JUL A/ SLF OR AS, DX: DYSPEPSIA

## 2013-04-03 ENCOUNTER — Encounter: Payer: Self-pay | Admitting: Gastroenterology

## 2013-04-09 NOTE — Progress Notes (Signed)
Pt is aware of OV on 7/9 at 0945 with AS and appt card was mailed

## 2013-04-24 ENCOUNTER — Encounter: Payer: Self-pay | Admitting: Gastroenterology

## 2013-04-25 ENCOUNTER — Encounter: Payer: Self-pay | Admitting: Gastroenterology

## 2013-04-25 ENCOUNTER — Ambulatory Visit (INDEPENDENT_AMBULATORY_CARE_PROVIDER_SITE_OTHER): Payer: BC Managed Care – PPO | Admitting: Gastroenterology

## 2013-04-25 VITALS — BP 152/82 | HR 77 | Temp 98.4°F | Ht 64.0 in | Wt 217.2 lb

## 2013-04-25 DIAGNOSIS — R1013 Epigastric pain: Secondary | ICD-10-CM

## 2013-04-25 NOTE — Progress Notes (Signed)
Cc PCP 

## 2013-04-25 NOTE — Patient Instructions (Addendum)
Return in 1 year to see Dr. Darrick Penna.   Please call us if any problems in the meantime.  Continue to take Protonix once daily.

## 2013-04-25 NOTE — Progress Notes (Signed)
Referring Provider: Babs Sciara, MD Primary Care Physician:  Lilyan Punt, MD Primary GI: Dr. Darrick Penna   Chief Complaint  Patient presents with  . Follow-up    HPI:   Beth Mayo presents today in follow-up after EGD due to dyspepsia. Findings of moderate gastritis and duodenitis, negative H.pylori, in the setting of NSAIDs. Denies abdominal pain. Taking Protonix once daily now. Wants to get rid of abdominal fat. Down 3 lbs from last visit. Only eats one meal per day, usually at dinner time.   Past Medical History  Diagnosis Date  . Hypertension   . Chronic kidney disease     kidney stones  . GERD (gastroesophageal reflux disease)   . Headache(784.0)     migraines  . Spinal headache     Past Surgical History  Procedure Laterality Date  . Colonoscopy  01/18/2008      SLF: Moderate internal hemorrhoids.Otherwise no polyps, masses inflammatory changes, diverticular  AVMs.  . Cesarean section      X2  . Tonsillectomy    . Rotator cuff right Right   . Breast surgery      breast reduction  . Dilation and curettage of uterus      for miscarriage  . Tubal ligation    . Esophagogastroduodenoscopy N/A 12/04/2012    [id number] ring was found at the gastroesophageal junction/Small hiatal hernia/ MODERATE Erosive gastritis/MILD DUODENITIS in the bulb and second portion of the duodenum/NEW ONSET DYSPEPSIA DUE TO NSAID INDUCED GASTRITIS/DUODENITIS, negative path    Current Outpatient Prescriptions  Medication Sig Dispense Refill  . albuterol (PROVENTIL HFA;VENTOLIN HFA) 108 (90 BASE) MCG/ACT inhaler Inhale 2 puffs into the lungs every 6 (six) hours as needed for wheezing.  1 Inhaler  2  . benzonatate (TESSALON) 100 MG capsule Take 1 capsule (100 mg total) by mouth 3 (three) times daily as needed for cough.  20 capsule  2  . pantoprazole (PROTONIX) 40 MG tablet 1 po 30 minutes prior to meals bid for 3 mos then once daily  62 tablet  11  . triamterene-hydrochlorothiazide (DYAZIDE)  37.5-25 MG per capsule Take 1 capsule by mouth daily.        No current facility-administered medications for this visit.    Allergies as of 04/25/2013 - Review Complete 04/25/2013  Allergen Reaction Noted  . Codeine Other (See Comments) 11/29/2012  . Phenobarbital Other (See Comments) 12/04/2012    Family History  Problem Relation Age of Onset  . Colon cancer Neg Hx     History   Social History  . Marital Status: Married    Spouse Name: N/A    Number of Children: N/A  . Years of Education: N/A   Occupational History  . 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 Smoking status: Never Smoker   . Smokeless tobacco: None  . Alcohol Use: Yes     Comment: socially  . Drug Use: No  . Sexually Active: None   Other Topics Concern  . None   Social History Narrative  . None    Review of Systems: Negative unless mentioned in HPI  Physical Exam: BP 152/82  Pulse 77  Temp(Src) 98.4 F (36.9 C) (Oral)  Ht 5\' 4"  (1.626 m)  Wt 217 lb 3.2 oz (98.521 kg)  BMI 37.26 kg/m2 General:   Alert and oriented. No distress noted. Pleasant and cooperative.  Head:  Normocephalic and atraumatic. Eyes:  Conjuctiva clear without scleral icterus. Mouth:  Oral mucosa pink and moist. Good dentition. No lesions. Heart:  S1, S2 present without murmurs, rubs, or gallops. Regular rate and rhythm. Abdomen:  +BS, soft, non-tender and non-distended. No rebound or guarding. No HSM or masses noted. Msk:  Symmetrical without gross deformities. Normal posture. Extremities:  Without edema. Neurologic:  Alert and  oriented x4;  grossly normal neurologically. Skin:  Intact without significant lesions or rashes. Psych:  Alert and cooperative. Normal mood and affect.

## 2013-04-25 NOTE — Assessment & Plan Note (Signed)
62 year old female with dyspepsia secondary to aspirin powders, now resolved with cessation. EGD noted gastritis and duodenitis, negative H.pylori. She notes overall improvement at this time. Not stated above, she does note intermittent cough, and wonders if this is secondary to GERD. I have asked her to continue Protonix once each morning, and I have provided samples of Prilosec to take at night for 14 days. She is to call if no improvement. Discussed weight loss measures. Return in 1 year or sooner if needed.

## 2013-06-21 ENCOUNTER — Other Ambulatory Visit: Payer: Self-pay | Admitting: Gynecology

## 2013-07-09 ENCOUNTER — Other Ambulatory Visit: Payer: Self-pay | Admitting: Family Medicine

## 2013-08-13 ENCOUNTER — Other Ambulatory Visit: Payer: Self-pay | Admitting: Family Medicine

## 2013-09-11 ENCOUNTER — Other Ambulatory Visit: Payer: Self-pay | Admitting: Family Medicine

## 2013-10-09 ENCOUNTER — Other Ambulatory Visit: Payer: Self-pay | Admitting: Family Medicine

## 2013-11-15 ENCOUNTER — Other Ambulatory Visit: Payer: Self-pay | Admitting: Family Medicine

## 2013-12-09 ENCOUNTER — Other Ambulatory Visit (HOSPITAL_COMMUNITY): Payer: Self-pay | Admitting: Gastroenterology

## 2013-12-12 ENCOUNTER — Other Ambulatory Visit: Payer: Self-pay | Admitting: Family Medicine

## 2014-01-01 NOTE — Progress Notes (Signed)
REVIEWED.  

## 2014-01-21 ENCOUNTER — Ambulatory Visit (INDEPENDENT_AMBULATORY_CARE_PROVIDER_SITE_OTHER): Payer: BC Managed Care – PPO | Admitting: Family Medicine

## 2014-01-21 ENCOUNTER — Encounter: Payer: Self-pay | Admitting: Family Medicine

## 2014-01-21 VITALS — BP 176/98 | Ht 64.0 in | Wt 217.0 lb

## 2014-01-21 DIAGNOSIS — M545 Low back pain, unspecified: Secondary | ICD-10-CM

## 2014-01-21 LAB — POCT URINALYSIS DIPSTICK
PH UA: 5
SPEC GRAV UA: 1.02

## 2014-01-21 MED ORDER — PREDNISONE 20 MG PO TABS: ORAL_TABLET | ORAL | Status: AC

## 2014-01-21 MED ORDER — TRAMADOL HCL 50 MG PO TABS: 50.0000 mg | ORAL_TABLET | Freq: Four times a day (QID) | ORAL | Status: DC | PRN

## 2014-01-21 NOTE — Progress Notes (Signed)
   Subjective:    Patient ID: Beth Mayo, female    DOB: 1951-06-11, 63 y.o.   MRN: [id number]  Back Pain This is a new problem. Episode onset: Friday. The problem occurs constantly. The pain is present in the sacro-iliac. The quality of the pain is described as cramping and burning. Radiates to: Left flank pain. The pain is at a severity of 10/10. The pain is severe. The pain is the same all the time. The symptoms are aggravated by sitting (walking). Stiffness is present in the morning. Associated symptoms include abdominal pain (feels burning) and pelvic pain. Pertinent negatives include no chest pain. She has tried NSAIDs for the symptoms. The treatment provided no relief.      Review of Systems  Cardiovascular: Negative for chest pain.  Gastrointestinal: Positive for nausea and abdominal pain (feels burning).  Genitourinary: Positive for pelvic pain.  Musculoskeletal: Positive for back pain.       Objective:   Physical Exam  Vitals reviewed. Constitutional: She appears well-nourished. No distress.  Cardiovascular: Normal rate, regular rhythm and normal heart sounds.   No murmur heard. Pulmonary/Chest: Effort normal and breath sounds normal. No respiratory distress.  Musculoskeletal: She exhibits no edema.  Lymphadenopathy:    She has no cervical adenopathy.  Neurological: She is alert. She exhibits normal muscle tone.  Psychiatric: Her behavior is normal.   Patient has difficulty standing twisting sitting on exam table Lane back her main pain is in her left lower back. It does radiate around toward the front. Her abdomen is soft there is no guarding rebound or tenderness.  Urinalysis did not show any rbc's showed a few WBCs. Afebrile. Patient denies fever blood pressure elevated because of pain    Assessment & Plan:  Patient was educated about what shingles looks like she was instructed if she starts seeing this to call immediately  Prednisone taper hopefully this will  help I believe she has possibility of herniated disc. She is having radicular pain. Shingles cannot be ruled out at this point. Tramadol for pain and discomfort recheck in one week's time. If worse to call. There is no convincing abdominal pain going on if she has increased abdominal pain discomfort may need to contemplate doing a scan at this point in time not necessary. Patient did have increased pain in her back with straight leg raise on the left side.

## 2014-01-23 ENCOUNTER — Other Ambulatory Visit: Payer: Self-pay

## 2014-01-23 MED ORDER — CEFPROZIL 500 MG PO TABS: 500.0000 mg | ORAL_TABLET | Freq: Two times a day (BID) | ORAL | Status: DC

## 2014-01-24 LAB — URINE CULTURE

## 2014-01-28 ENCOUNTER — Ambulatory Visit (INDEPENDENT_AMBULATORY_CARE_PROVIDER_SITE_OTHER): Payer: BC Managed Care – PPO | Admitting: Family Medicine

## 2014-01-28 ENCOUNTER — Encounter: Payer: Self-pay | Admitting: Family Medicine

## 2014-01-28 VITALS — BP 130/88 | Ht 64.0 in | Wt 218.2 lb

## 2014-01-28 DIAGNOSIS — Z79899 Other long term (current) drug therapy: Secondary | ICD-10-CM

## 2014-01-28 DIAGNOSIS — M549 Dorsalgia, unspecified: Secondary | ICD-10-CM

## 2014-01-28 DIAGNOSIS — I10 Essential (primary) hypertension: Secondary | ICD-10-CM

## 2014-01-28 DIAGNOSIS — N390 Urinary tract infection, site not specified: Secondary | ICD-10-CM

## 2014-01-28 LAB — POCT URINALYSIS DIPSTICK
Spec Grav, UA: <=1.005
pH, UA: 6

## 2014-01-28 NOTE — Progress Notes (Signed)
   Subjective:    Patient ID: Beth Mayo, female    DOB: November 23, 1950, 63 y.o.   MRN: [id number]  Back Pain This is a recurrent problem. The current episode started 1 to 4 weeks ago. The problem occurs intermittently. The problem has been gradually improving since onset. The pain is present in the lumbar spine. The quality of the pain is described as aching. The pain does not radiate. The pain is at a severity of 4/10. The pain is mild. The pain is the same all the time. She has tried NSAIDs for the symptoms. The treatment provided significant relief.   Patient also wants her urine checked.    Review of Systems  Musculoskeletal: Positive for back pain.   relates mainly pain and lower back radiated into her left lower quadrant denied any other particular problems.     Objective:   Physical Exam Lungs are clear hearts regular flanks nontender abdomen soft urinalysis looks much improved       Assessment & Plan:  Low back pain-actually doing much better, may stop prednisone UTI finishing the antibiotics doing well Has history of HTN continue diuretic check metabolic 7 check lipid profile

## 2014-01-28 NOTE — Patient Instructions (Signed)
DASH Diet  The DASH diet stands for "Dietary Approaches to Stop Hypertension." It is a healthy eating plan that has been shown to reduce high blood pressure (hypertension) in as little as 14 days, while also possibly providing other significant health benefits. These other health benefits include reducing the risk of breast cancer after menopause and reducing the risk of type 2 diabetes, heart disease, colon cancer, and stroke. Health benefits also include weight loss and slowing kidney failure in patients with chronic kidney disease.   DIET GUIDELINES  · Limit salt (sodium). Your diet should contain less than 1500 mg of sodium daily.  · Limit refined or processed carbohydrates. Your diet should include mostly whole grains. Desserts and added sugars should be used sparingly.  · Include small amounts of heart-healthy fats. These types of fats include nuts, oils, and tub margarine. Limit saturated and trans fats. These fats have been shown to be harmful in the body.  CHOOSING FOODS   The following food groups are based on a 2000 calorie diet. See your Registered Dietitian for individual calorie needs.  Grains and Grain Products (6 to 8 servings daily)  · Eat More Often: Whole-wheat bread, brown rice, whole-grain or wheat pasta, quinoa, popcorn without added fat or salt (air popped).  · Eat Less Often: White bread, white pasta, white rice, cornbread.  Vegetables (4 to 5 servings daily)  · Eat More Often: Fresh, frozen, and canned vegetables. Vegetables may be raw, steamed, roasted, or grilled with a minimal amount of fat.  · Eat Less Often/Avoid: Creamed or fried vegetables. Vegetables in a cheese sauce.  Fruit (4 to 5 servings daily)  · Eat More Often: All fresh, canned (in natural juice), or frozen fruits. Dried fruits without added sugar. One hundred percent fruit juice (½ cup [237 mL] daily).  · Eat Less Often: Dried fruits with added sugar. Canned fruit in light or heavy syrup.  Lean Meats, Fish, and Poultry (2  servings or less daily. One serving is 3 to 4 oz [85-114 g]).  · Eat More Often: Ninety percent or leaner ground beef, tenderloin, sirloin. Round cuts of beef, chicken breast, turkey breast. All fish. Grill, bake, or broil your meat. Nothing should be fried.  · Eat Less Often/Avoid: Fatty cuts of meat, turkey, or chicken leg, thigh, or wing. Fried cuts of meat or fish.  Dairy (2 to 3 servings)  · Eat More Often: Low-fat or fat-free milk, low-fat plain or light yogurt, reduced-fat or part-skim cheese.  · Eat Less Often/Avoid: Milk (whole, 2%). Whole milk yogurt. Full-fat cheeses.  Nuts, Seeds, and Legumes (4 to 5 servings per week)  · Eat More Often: All without added salt.  · Eat Less Often/Avoid: Salted nuts and seeds, canned beans with added salt.  Fats and Sweets (limited)  · Eat More Often: Vegetable oils, tub margarines without trans fats, sugar-free gelatin. Mayonnaise and salad dressings.  · Eat Less Often/Avoid: Coconut oils, palm oils, butter, stick margarine, cream, half and half, cookies, candy, pie.  FOR MORE INFORMATION  The Dash Diet Eating Plan: www.dashdiet.org  Document Released: 09/23/2011 Document Revised: 12/27/2011 Document Reviewed: 09/23/2011  ExitCare® Patient Information ©2014 [employer].

## 2014-02-08 ENCOUNTER — Other Ambulatory Visit: Payer: Self-pay | Admitting: Family Medicine

## 2014-02-08 MED ORDER — DICLOFENAC SODIUM 75 MG PO TBEC: 75.0000 mg | DELAYED_RELEASE_TABLET | Freq: Two times a day (BID) | ORAL | Status: DC

## 2014-02-08 NOTE — Progress Notes (Signed)
The patient called with discomfort that starts from the back or radiates across her hip and into the groin on the right side it is worse with rotating to the left and extension making her her in her back we will stop ibuprofen. Use Voltaren twice a day. Followup on Monday at 11 AM. More than likely will need to do some testing. Call if worse.

## 2014-02-11 ENCOUNTER — Ambulatory Visit (HOSPITAL_COMMUNITY)
Admission: RE | Admit: 2014-02-11 | Discharge: 2014-02-11 | Disposition: A | Discharge status: Home / Self Care | Payer: BC Managed Care – PPO | Source: Ambulatory Visit | Attending: Family Medicine | Admitting: Family Medicine

## 2014-02-11 ENCOUNTER — Ambulatory Visit (INDEPENDENT_AMBULATORY_CARE_PROVIDER_SITE_OTHER): Payer: BC Managed Care – PPO | Admitting: Family Medicine

## 2014-02-11 ENCOUNTER — Encounter: Payer: Self-pay | Admitting: Family Medicine

## 2014-02-11 VITALS — BP 132/88 | Temp 98.7°F | Ht 64.0 in | Wt 216.0 lb

## 2014-02-11 DIAGNOSIS — M545 Low back pain, unspecified: Secondary | ICD-10-CM | POA: Insufficient documentation

## 2014-02-11 DIAGNOSIS — R109 Unspecified abdominal pain: Secondary | ICD-10-CM

## 2014-02-11 DIAGNOSIS — M549 Dorsalgia, unspecified: Secondary | ICD-10-CM

## 2014-02-11 DIAGNOSIS — M47817 Spondylosis without myelopathy or radiculopathy, lumbosacral region: Secondary | ICD-10-CM | POA: Insufficient documentation

## 2014-02-11 DIAGNOSIS — R5381 Other malaise: Secondary | ICD-10-CM

## 2014-02-11 DIAGNOSIS — R5383 Other fatigue: Secondary | ICD-10-CM

## 2014-02-11 LAB — HEPATIC FUNCTION PANEL
ALBUMIN: 4.2 g/dL (ref 3.5–5.2)
ALT: 17 U/L (ref 0–35)
AST: 17 U/L (ref 0–37)
Alkaline Phosphatase: 92 U/L (ref 39–117)
BILIRUBIN DIRECT: 0.1 mg/dL (ref 0.0–0.3)
Indirect Bilirubin: 0.4 mg/dL (ref 0.2–1.2)
TOTAL PROTEIN: 6.9 g/dL (ref 6.0–8.3)
Total Bilirubin: 0.5 mg/dL (ref 0.2–1.2)

## 2014-02-11 LAB — POCT URINALYSIS DIPSTICK
Spec Grav, UA: <=1.005
pH, UA: 6

## 2014-02-11 LAB — CBC WITH DIFFERENTIAL/PLATELET
BASOS ABS: 0 10*3/uL (ref 0.0–0.1)
BASOS PCT: 0 % (ref 0–1)
EOS ABS: 0.2 10*3/uL (ref 0.0–0.7)
Eosinophils Relative: 3 % (ref 0–5)
HEMATOCRIT: 39 % (ref 36.0–46.0)
HEMOGLOBIN: 13.2 g/dL (ref 12.0–15.0)
Lymphocytes Relative: 39 % (ref 12–46)
Lymphs Abs: 2.5 10*3/uL (ref 0.7–4.0)
MCH: 28.6 pg (ref 26.0–34.0)
MCHC: 33.8 g/dL (ref 30.0–36.0)
MCV: 84.4 fL (ref 78.0–100.0)
MONO ABS: 0.4 10*3/uL (ref 0.1–1.0)
MONOS PCT: 6 % (ref 3–12)
NEUTROS ABS: 3.4 10*3/uL (ref 1.7–7.7)
NEUTROS PCT: 52 % (ref 43–77)
Platelets: 248 10*3/uL (ref 150–400)
RBC: 4.62 MIL/uL (ref 3.87–5.11)
RDW: 14.5 % (ref 11.5–15.5)
WBC: 6.5 10*3/uL (ref 4.0–10.5)

## 2014-02-11 LAB — BASIC METABOLIC PANEL
BUN: 9 mg/dL (ref 6–23)
CALCIUM: 9.7 mg/dL (ref 8.4–10.5)
CO2: 31 mEq/L (ref 19–32)
CREATININE: 0.93 mg/dL (ref 0.50–1.10)
Chloride: 101 mEq/L (ref 96–112)
Glucose, Bld: 137 mg/dL — ABNORMAL HIGH (ref 70–99)
Potassium: 4 mEq/L (ref 3.5–5.3)
SODIUM: 141 meq/L (ref 135–145)

## 2014-02-11 LAB — TSH: TSH: 0.932 u[IU]/mL (ref 0.350–4.500)

## 2014-02-11 NOTE — Progress Notes (Signed)
   Subjective:    Patient ID: Beth Mayo, female    DOB: [birth year], 63 y.o.   MRN: [id number]  Abdominal Pain This is a recurrent problem. Pain location: low abdominal  Treatments tried: cefzil.   She denies vomiting diarrhea sweats chills denies dysuria urinary frequency. She has taken anti-inflammatory currently. She is hard he finished antibiotic for urinary tract infection for   Review of Systems  Gastrointestinal: Positive for abdominal pain.       Objective:   Physical Exam  Lungs clear hearts regular subjective discomfort low back negative straight leg raise pain radiates around to the right lower quadrant abdomen mild right lower quadrant tenderness no guarding or rebound      Assessment & Plan:  This is the second dysuria spell of this discomfort occurring. This patient would benefit from having lumbar spine x-rays as well as lab work. The concerning part is a tenderness in the lower abdomen she may well in fact need a CAT scan of the abdomen. Hold on MRI on the back currently. She is taking anti-inflammatory twice daily. Await lab testing and urine culture.

## 2014-02-13 LAB — URINE CULTURE
COLONY COUNT: NO GROWTH
ORGANISM ID, BACTERIA: NO GROWTH

## 2014-04-19 ENCOUNTER — Other Ambulatory Visit: Payer: Self-pay | Admitting: Family Medicine

## 2014-05-15 ENCOUNTER — Encounter: Payer: Self-pay | Admitting: Gastroenterology

## 2014-05-16 IMAGING — CR DG LUMBAR SPINE COMPLETE 4+V
5 series · 5 of 5 positions shown · non-contrast
Comparison: None.

CLINICAL DATA: Low back pain for 4 weeks

EXAM:
LUMBAR SPINE - COMPLETE 4+ VIEW

[view not recorded (1 of 5)]
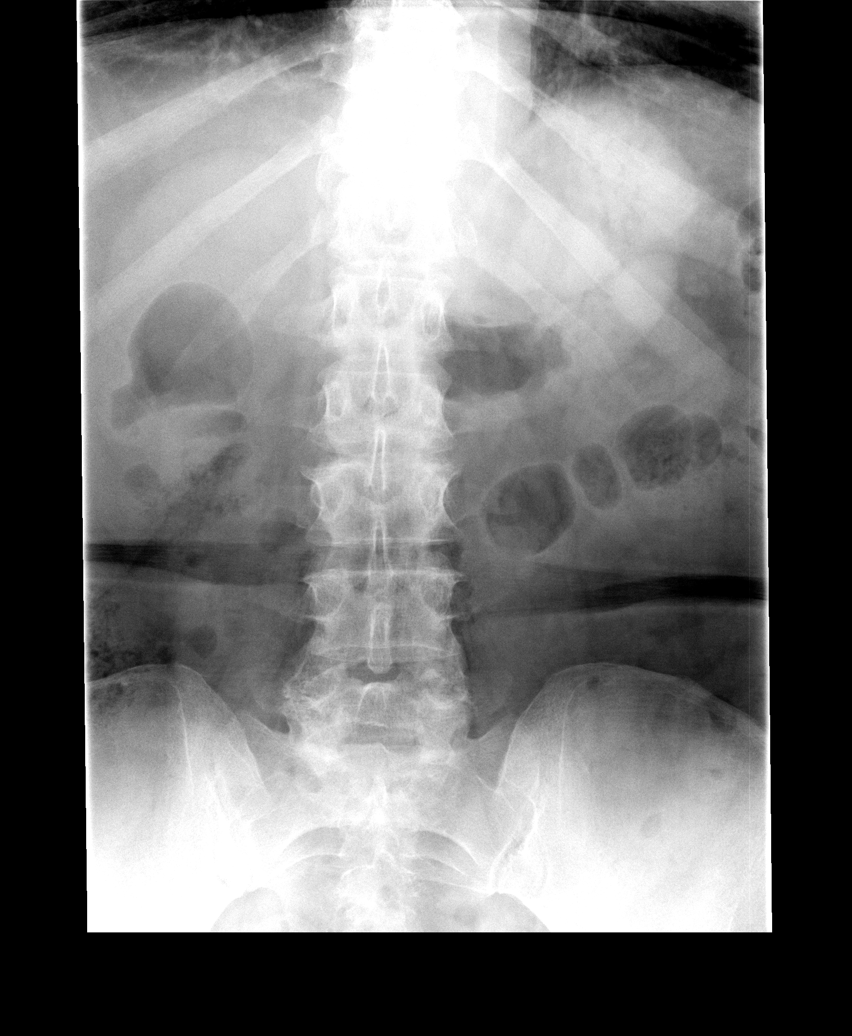

[view not recorded (2 of 5)]
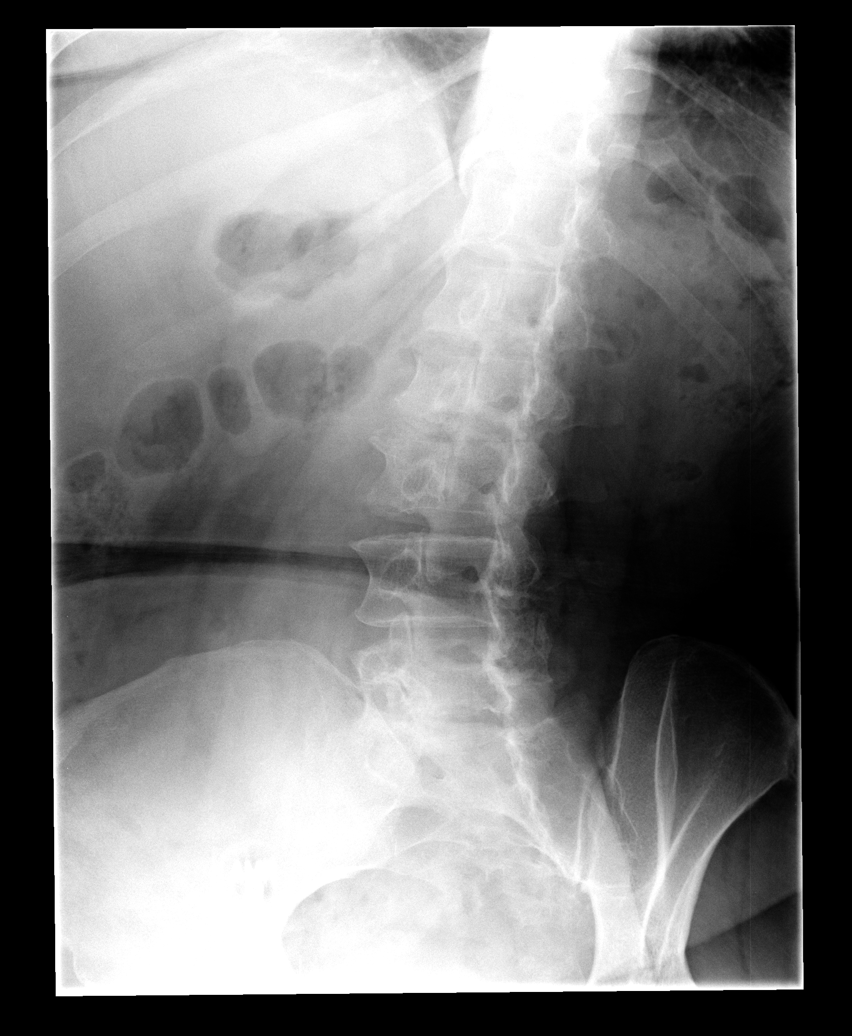

[view not recorded (3 of 5)]
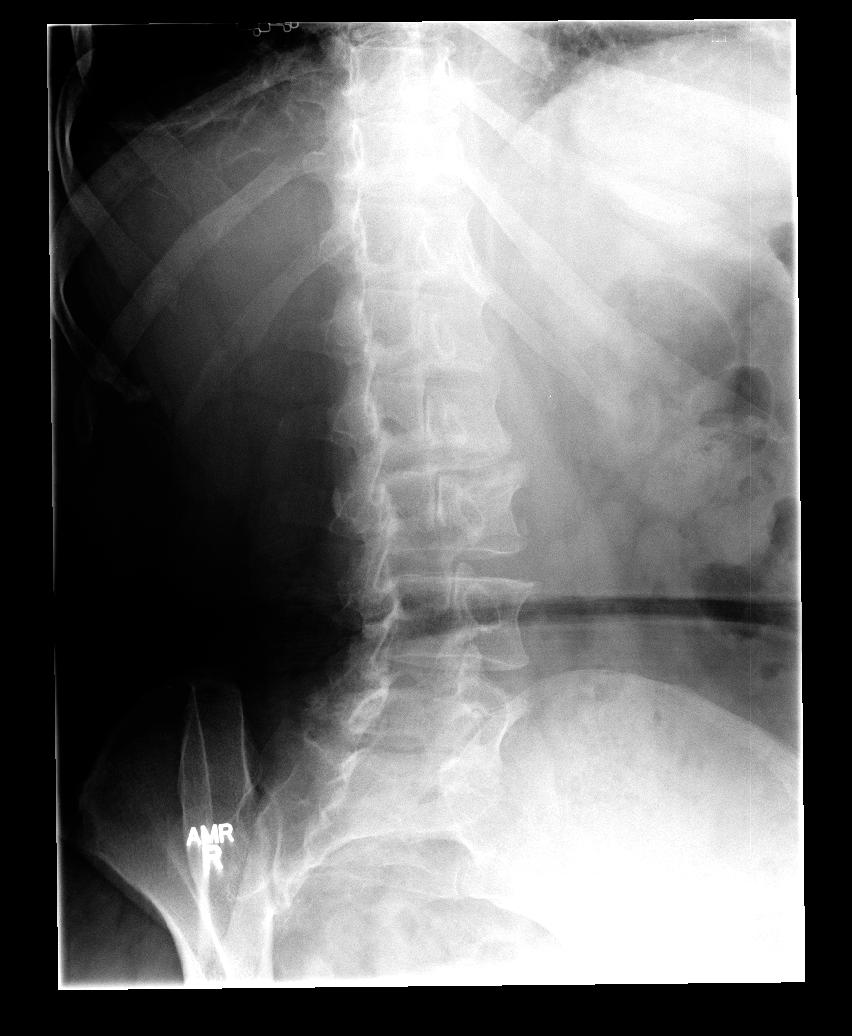

[view not recorded (4 of 5)]
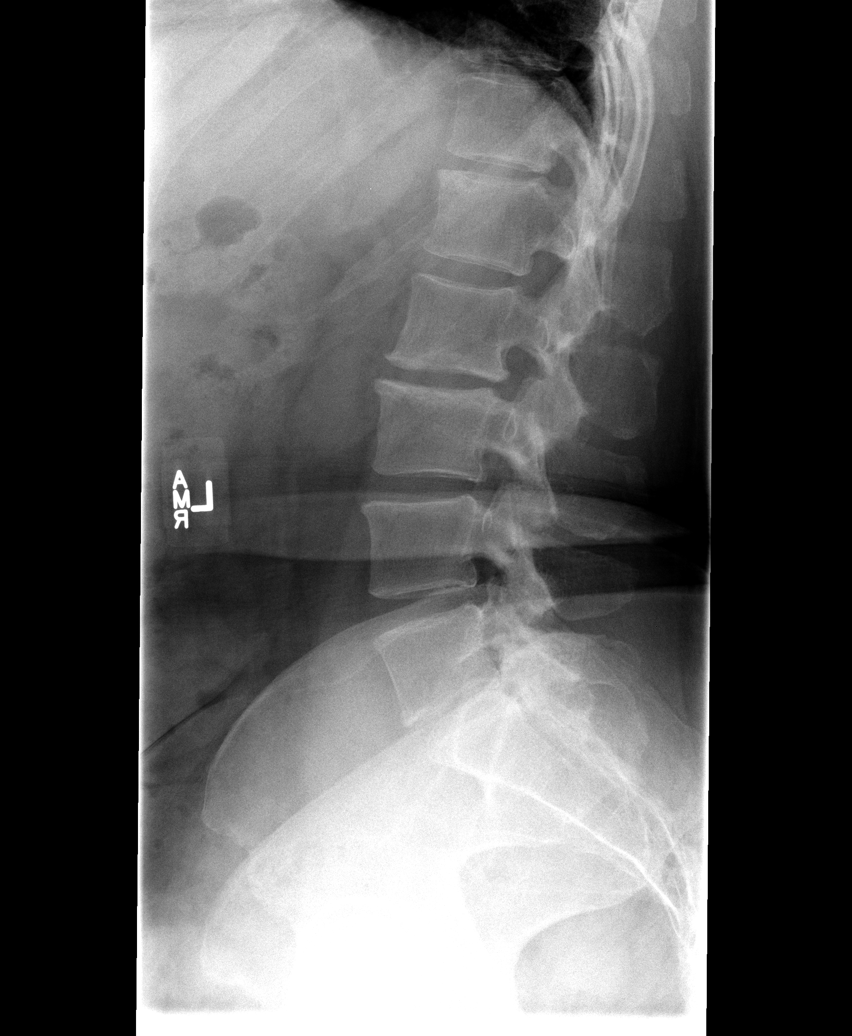

[view not recorded (5 of 5)]
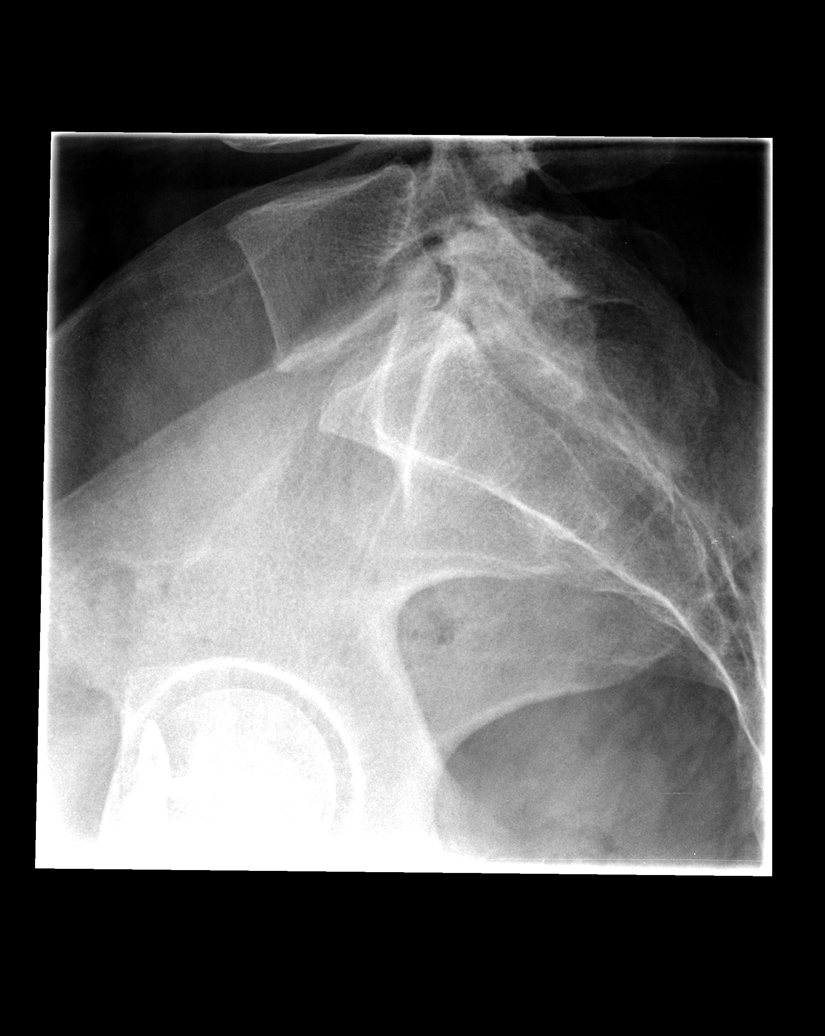

[5 of 5 positions shown; findings below may reference images not displayed]

FINDINGS: Frontal, lateral, spot lumbosacral lateral, and bilateral oblique
views were obtained. There are 5 non-rib-bearing lumbar type
vertebral bodies. There is no fracture or spondylolisthesis. There
is moderate disc space narrowing at L2-3 and L5-S1. There is facet
osteoarthritic change at L4-5 and L5-S1 bilaterally.
IMPRESSION: Areas of osteoarthritic change.  No fracture or spondylolisthesis.

## 2014-05-27 ENCOUNTER — Other Ambulatory Visit: Payer: Self-pay | Admitting: Family Medicine

## 2014-07-11 ENCOUNTER — Other Ambulatory Visit: Payer: Self-pay | Admitting: Gynecology

## 2014-07-12 LAB — CYTOLOGY - PAP

## 2014-07-29 ENCOUNTER — Encounter: Payer: Self-pay | Admitting: Family Medicine

## 2014-07-29 ENCOUNTER — Ambulatory Visit (INDEPENDENT_AMBULATORY_CARE_PROVIDER_SITE_OTHER): Payer: BC Managed Care – PPO | Admitting: Family Medicine

## 2014-07-29 VITALS — Temp 98.9°F | Ht 64.0 in | Wt 207.4 lb

## 2014-07-29 DIAGNOSIS — H9201 Otalgia, right ear: Secondary | ICD-10-CM

## 2014-07-29 NOTE — Progress Notes (Signed)
   Subjective:    Patient ID: Beth Mayo, female    DOB: 04-Jan-1951, 63 y.o.   MRN: [id number]  Otalgia  There is pain in the right ear. This is a new problem. The current episode started 1 to 4 weeks ago. Treatments tried: sweet oil.   No true headache. No recent cough or congestion or drainage. Minimal allergies.    The patient does has a history of bruxism.  She is supposed to wear a mouthpiece at night she has not been.  Does note some right jaw discomfort.  No fever or chills.   Review of Systems  HENT: Positive for ear pain.    no fever no chills no cough     Objective:   Physical Exam  Alert no acute distress. Vital stable. TMs normal pharynx normal neck supple. Lungs clear. Heart regular in rhythm. No TMJ tenderness. Teeth some evidence of teeth grinding      Assessment & Plan:  Impression potential bruxism with secondary pain. No evidence of infection or lymphadenitis or tonsillitis discussed plan Advil 3 times a day next few days. Local measures discussed. Wear night guard. WSL

## 2014-11-24 ENCOUNTER — Other Ambulatory Visit: Payer: Self-pay | Admitting: Family Medicine

## 2014-12-07 ENCOUNTER — Other Ambulatory Visit: Payer: Self-pay | Admitting: Family Medicine

## 2014-12-09 ENCOUNTER — Telehealth: Payer: Self-pay | Admitting: Family Medicine

## 2014-12-09 MED ORDER — TRIAMTERENE-HCTZ 37.5-25 MG PO CAPS: 1.0000 | ORAL_CAPSULE | Freq: Every day | ORAL | Status: DC

## 2014-12-09 NOTE — Telephone Encounter (Signed)
Please call pt let her know to sched OV within 3 to 4 weeks , 30 days sent in

## 2014-12-09 NOTE — Telephone Encounter (Signed)
Left message on voicemail notifying patient med sent to pharmacy and needs office visit for any further refills

## 2014-12-15 ENCOUNTER — Other Ambulatory Visit: Payer: Self-pay | Admitting: Gastroenterology

## 2015-01-07 ENCOUNTER — Other Ambulatory Visit: Payer: Self-pay | Admitting: Family Medicine

## 2015-01-27 ENCOUNTER — Ambulatory Visit (INDEPENDENT_AMBULATORY_CARE_PROVIDER_SITE_OTHER): Payer: BC Managed Care – PPO | Admitting: Family Medicine

## 2015-01-27 VITALS — BP 120/82 | Wt 212.6 lb

## 2015-01-27 DIAGNOSIS — E785 Hyperlipidemia, unspecified: Secondary | ICD-10-CM

## 2015-01-27 DIAGNOSIS — I10 Essential (primary) hypertension: Secondary | ICD-10-CM | POA: Insufficient documentation

## 2015-01-27 MED ORDER — TRIAMTERENE-HCTZ 37.5-25 MG PO CAPS: ORAL_CAPSULE | ORAL | Status: DC

## 2015-01-27 NOTE — Patient Instructions (Signed)
DASH Eating Plan °DASH stands for "Dietary Approaches to Stop Hypertension." The DASH eating plan is a healthy eating plan that has been shown to reduce high blood pressure (hypertension). Additional health benefits may include reducing the risk of type 2 diabetes mellitus, heart disease, and stroke. The DASH eating plan may also help with weight loss. °WHAT DO I NEED TO KNOW ABOUT THE DASH EATING PLAN? °For the DASH eating plan, you will follow these general guidelines: °· Choose foods with a percent daily value for sodium of less than 5% (as listed on the food label). °· Use salt-free seasonings or herbs instead of table salt or sea salt. °· Check with your health care provider or pharmacist before using salt substitutes. °· Eat lower-sodium products, often labeled as "lower sodium" or "no salt added." °· Eat fresh foods. °· Eat more vegetables, fruits, and low-fat dairy products. °· Choose whole grains. Look for the word "whole" as the first word in the ingredient list. °· Choose fish and skinless chicken or turkey more often than red meat. Limit fish, poultry, and meat to 6 oz (170 g) each day. °· Limit sweets, desserts, sugars, and sugary drinks. °· Choose heart-healthy fats. °· Limit cheese to 1 oz (28 g) per day. °· Eat more home-cooked food and less restaurant, buffet, and fast food. °· Limit fried foods. °· Cook foods using methods other than frying. °· Limit canned vegetables. If you do use them, rinse them well to decrease the sodium. °· When eating at a restaurant, ask that your food be prepared with less salt, or no salt if possible. °WHAT FOODS CAN I EAT? °Seek help from a dietitian for individual calorie needs. °Grains °Whole grain or whole wheat bread. Brown rice. Whole grain or whole wheat pasta. Quinoa, bulgur, and whole grain cereals. Low-sodium cereals. Corn or whole wheat flour tortillas. Whole grain cornbread. Whole grain crackers. Low-sodium crackers. °Vegetables °Fresh or frozen vegetables  (raw, steamed, roasted, or grilled). Low-sodium or reduced-sodium tomato and vegetable juices. Low-sodium or reduced-sodium tomato sauce and paste. Low-sodium or reduced-sodium canned vegetables.  °Fruits °All fresh, canned (in natural juice), or frozen fruits. °Meat and Other Protein Products °Ground beef (85% or leaner), grass-fed beef, or beef trimmed of fat. Skinless chicken or turkey. Ground chicken or turkey. Pork trimmed of fat. All fish and seafood. Eggs. Dried beans, peas, or lentils. Unsalted nuts and seeds. Unsalted canned beans. °Dairy °Low-fat dairy products, such as skim or 1% milk, 2% or reduced-fat cheeses, low-fat ricotta or cottage cheese, or plain low-fat yogurt. Low-sodium or reduced-sodium cheeses. °Fats and Oils °Tub margarines without trans fats. Light or reduced-fat mayonnaise and salad dressings (reduced sodium). Avocado. Safflower, olive, or canola oils. Natural peanut or almond butter. °Other °Unsalted popcorn and pretzels. °The items listed above may not be a complete list of recommended foods or beverages. Contact your dietitian for more options. °WHAT FOODS ARE NOT RECOMMENDED? °Grains °White bread. White pasta. White rice. Refined cornbread. Bagels and croissants. Crackers that contain trans fat. °Vegetables °Creamed or fried vegetables. Vegetables in a cheese sauce. Regular canned vegetables. Regular canned tomato sauce and paste. Regular tomato and vegetable juices. °Fruits °Dried fruits. Canned fruit in light or heavy syrup. Fruit juice. °Meat and Other Protein Products °Fatty cuts of meat. Ribs, chicken wings, bacon, sausage, bologna, salami, chitterlings, fatback, hot dogs, bratwurst, and packaged luncheon meats. Salted nuts and seeds. Canned beans with salt. °Dairy °Whole or 2% milk, cream, half-and-half, and cream cheese. Whole-fat or sweetened yogurt. Full-fat   cheeses or blue cheese. Nondairy creamers and whipped toppings. Processed cheese, cheese spreads, or cheese  curds. °Condiments °Onion and garlic salt, seasoned salt, table salt, and sea salt. Canned and packaged gravies. Worcestershire sauce. Tartar sauce. Barbecue sauce. Teriyaki sauce. Soy sauce, including reduced sodium. Steak sauce. Fish sauce. Oyster sauce. Cocktail sauce. Horseradish. Ketchup and mustard. Meat flavorings and tenderizers. Bouillon cubes. Hot sauce. Tabasco sauce. Marinades. Taco seasonings. Relishes. °Fats and Oils °Butter, stick margarine, lard, shortening, ghee, and bacon fat. Coconut, palm kernel, or palm oils. Regular salad dressings. °Other °Pickles and olives. Salted popcorn and pretzels. °The items listed above may not be a complete list of foods and beverages to avoid. Contact your dietitian for more information. °WHERE CAN I FIND MORE INFORMATION? °National Heart, Lung, and Blood Institute: www.nhlbi.nih.gov/health/health-topics/topics/dash/ °Document Released: 09/23/2011 Document Revised: 02/18/2014 Document Reviewed: 08/08/2013 °ExitCare® Patient Information ©2015 [employer]. This information is not intended to replace advice given to you by your health care provider. Make sure you discuss any questions you have with your health care provider. ° °

## 2015-01-27 NOTE — Progress Notes (Signed)
   Subjective:    Patient ID: Beth Mayo, female    DOB: [birth year], 64 y.o.   MRN: [id number]  Hypertension This is a chronic problem. The current episode started more than 1 year ago. Pertinent negatives include no chest pain. Risk factors for coronary artery disease include obesity. Treatments tried: triam/hctz. There are no compliance problems.    she denies any chest tightness pressure pain or shortness of breath she does state that she eats a little more carbohydrates than she should in the form of candy but she is try to do better she is try to get walking in on a regular basis she denies any excessive swelling issues. She does take her medicines.  Review of Systems  Constitutional: Negative for activity change, appetite change and fatigue.  HENT: Negative for congestion.   Respiratory: Negative for cough.   Cardiovascular: Negative for chest pain.  Gastrointestinal: Negative for abdominal pain.  Endocrine: Negative for polydipsia and polyphagia.  Neurological: Negative for weakness.  Psychiatric/Behavioral: Negative for confusion.       Objective:   Physical Exam  Constitutional: She appears well-nourished. No distress.  Cardiovascular: Normal rate, regular rhythm and normal heart sounds.   No murmur heard. Pulmonary/Chest: Effort normal and breath sounds normal. No respiratory distress.  Musculoskeletal: She exhibits no edema.  Lymphadenopathy:    She has no cervical adenopathy.  Neurological: She is alert. She exhibits normal muscle tone.  Psychiatric: Her behavior is normal.  Vitals reviewed.         Assessment & Plan:  HTN-on recheck blood pressure is good. She is to continue her medication. In addition to this she needs to do lab work again in approximately the next few weeks we will let her know the results in addition to this follow-up in one years time sooner if any problems

## 2015-07-29 ENCOUNTER — Other Ambulatory Visit: Payer: Self-pay | Admitting: Obstetrics & Gynecology

## 2015-07-30 LAB — CYTOLOGY - PAP

## 2015-10-27 ENCOUNTER — Telehealth: Payer: Self-pay | Admitting: Family Medicine

## 2015-10-27 NOTE — Telephone Encounter (Signed)
Called patient and informed her per Dr.Scott Luking-Patient may have a prescription for shingles vaccine. Best completed through pharmacy such as [facility], [facility], [facility], [facility]. Patient verbalized understanding. Script for Zostavax ready for pick up.

## 2015-10-27 NOTE — Telephone Encounter (Signed)
Pt is wanting to get a shingles shot and is wanting to know what she has to do to get one. Please advise.

## 2015-10-27 NOTE — Telephone Encounter (Signed)
Patient may have a prescription for shingles vaccine. Best completed through pharmacy such as [facility], [facility], [facility], [facility]

## 2015-12-15 ENCOUNTER — Other Ambulatory Visit: Payer: Self-pay

## 2015-12-16 MED ORDER — PANTOPRAZOLE SODIUM 40 MG PO TBEC: DELAYED_RELEASE_TABLET | ORAL | Status: DC

## 2015-12-17 ENCOUNTER — Other Ambulatory Visit: Payer: Self-pay

## 2015-12-17 MED ORDER — PANTOPRAZOLE SODIUM 40 MG PO TBEC: DELAYED_RELEASE_TABLET | ORAL | Status: DC

## 2016-02-21 LAB — BASIC METABOLIC PANEL: Glucose: 108 mg/dL

## 2016-02-24 ENCOUNTER — Other Ambulatory Visit: Payer: Self-pay | Admitting: Family Medicine

## 2016-02-24 NOTE — Telephone Encounter (Signed)
Patient last seen April 2016

## 2016-02-24 NOTE — Telephone Encounter (Signed)
This +1 refill, needs office visit

## 2016-03-26 ENCOUNTER — Other Ambulatory Visit: Payer: Self-pay | Admitting: Family Medicine

## 2016-08-04 ENCOUNTER — Other Ambulatory Visit: Payer: Self-pay | Admitting: Obstetrics & Gynecology

## 2016-08-05 LAB — CYTOLOGY - PAP

## 2016-09-27 ENCOUNTER — Encounter: Payer: Self-pay | Admitting: Family Medicine

## 2016-09-27 ENCOUNTER — Encounter (HOSPITAL_COMMUNITY): Payer: Self-pay | Admitting: Emergency Medicine

## 2016-09-27 ENCOUNTER — Emergency Department (HOSPITAL_COMMUNITY): Payer: BC Managed Care – PPO

## 2016-09-27 ENCOUNTER — Ambulatory Visit (INDEPENDENT_AMBULATORY_CARE_PROVIDER_SITE_OTHER): Payer: BC Managed Care – PPO | Admitting: Family Medicine

## 2016-09-27 ENCOUNTER — Emergency Department (HOSPITAL_COMMUNITY)
Admission: EM | Admit: 2016-09-27 | Discharge: 2016-09-27 | Disposition: A | Discharge status: Home / Self Care | Payer: BC Managed Care – PPO | Attending: Emergency Medicine | Admitting: Emergency Medicine

## 2016-09-27 VITALS — BP 150/88 | Temp 98.5°F | Ht 64.0 in | Wt 215.0 lb

## 2016-09-27 DIAGNOSIS — I129 Hypertensive chronic kidney disease with stage 1 through stage 4 chronic kidney disease, or unspecified chronic kidney disease: Secondary | ICD-10-CM | POA: Insufficient documentation

## 2016-09-27 DIAGNOSIS — M79604 Pain in right leg: Secondary | ICD-10-CM | POA: Diagnosis not present

## 2016-09-27 DIAGNOSIS — M7121 Synovial cyst of popliteal space [Baker], right knee: Secondary | ICD-10-CM

## 2016-09-27 DIAGNOSIS — N189 Chronic kidney disease, unspecified: Secondary | ICD-10-CM | POA: Insufficient documentation

## 2016-09-27 DIAGNOSIS — M7989 Other specified soft tissue disorders: Secondary | ICD-10-CM

## 2016-09-27 DIAGNOSIS — M25561 Pain in right knee: Secondary | ICD-10-CM | POA: Diagnosis present

## 2016-09-27 DIAGNOSIS — M79661 Pain in right lower leg: Secondary | ICD-10-CM

## 2016-09-27 DIAGNOSIS — Z79899 Other long term (current) drug therapy: Secondary | ICD-10-CM | POA: Insufficient documentation

## 2016-09-27 DIAGNOSIS — M79606 Pain in leg, unspecified: Secondary | ICD-10-CM | POA: Insufficient documentation

## 2016-09-27 NOTE — Discharge Instructions (Signed)
Return if any problems.

## 2016-09-27 NOTE — Progress Notes (Signed)
   Subjective:    Patient ID: Beth Mayo, female    DOB: 1951/07/08, 65 y.o.   MRN: [id number]  HPI Patient is here today for a painful knot on her right leg. Onset 1 month ago. Treatments tried: Heat with no relief. This patient relates that over the past few days increased pain and discomfort behind her right knee hurts with movement hurts to pressure denies shortness of breath Patient has no other concerns at this time.    Review of Systems Denies shortness of breath chest pain does relate pain behind the right knee into the calf    Objective:   Physical Exam The right calf is 1/2 inch greater in diameter compared to the left side there is tenderness behind the right knee as well as into the calf       Assessment & Plan:  I believe this patient would benefit from having a d-dimer and stat ultrasound  We called to radiology at 4 PM to get this in were informed that this was impossible because they leave at 4 PM we were told that if the patient was seen through the ER that the tests could be completed as an emergency. Because the situation could be detrimental to the patient if delayed and because of of these rules-ER doctor was spoken with the patient was seen in ER for further evaluation to rule out the possibility of Baker cyst versus DVT

## 2016-09-27 NOTE — ED Provider Notes (Signed)
[facility] DEPT Provider Note   CSN: [id number] Arrival date & time: 09/27/16  1645  By signing my name below, I, Reola Mosher, attest that this documentation has been prepared under the direction and in the presence of Alyse Low, [place].  Electronically Signed: Reola Mosher, ED Scribe. 09/27/16. 7:44 PM.  History   Chief Complaint Chief Complaint  Patient presents with  . Leg Pain   The history is provided by the patient. No language interpreter was used.    HPI Comments: Beth Mayo is a 65 y.o. female with a PMHx of HTN, who presents to the Emergency Department complaining of gradually worsening, intermittent right left pain onset approximately 1 month ago. She notes a mild area of swelling to the posterior calf of the left leg. No h/o trauma or injury to the leg. She reports that she was seen by her PCP for this issue today prior to coming into the ED. At that time she was advised to come into the ED for emergent [place] and blood work to be performed to r/o DVT/PE. She is not currently followed by an orthopedic specialist. Her pain is exacerbated with palpation over the area. No noted treatments were tried prior to coming into the ED. Pt denies weakness, numbness, or any other associated symptoms.   Past Medical History:  Diagnosis Date  . Chronic kidney disease    kidney stones  . GERD (gastroesophageal reflux disease)   . Headache(784.0)    migraines  . Hypertension   . Spinal headache    Patient Active Problem List   Diagnosis Date Noted  . Leg pain 09/27/2016  . HTN (hypertension) 01/27/2015  . Dyspepsia 11/30/2012   Past Surgical History:  Procedure Laterality Date  . BREAST SURGERY     breast reduction  . CESAREAN SECTION     X2  . COLONOSCOPY  01/18/2008     SLF: Moderate internal hemorrhoids.Otherwise no polyps, masses inflammatory changes, diverticular  AVMs.  Marland Kitchen DILATION AND CURETTAGE OF UTERUS     for miscarriage  .  ESOPHAGOGASTRODUODENOSCOPY N/A 12/04/2012   [id number] ring was found at the gastroesophageal junction/Small hiatal hernia/ MODERATE Erosive gastritis/MILD DUODENITIS in the bulb and second portion of the duodenum/NEW ONSET DYSPEPSIA DUE TO NSAID INDUCED GASTRITIS/DUODENITIS, negative path  . rotator cuff right Right   . TONSILLECTOMY    . TUBAL LIGATION     OB History    Gravida Para Term Preterm AB Living   4 2 1 1 2      SAB TAB Ectopic Multiple Live Births   1 1           Home Medications    Prior to Admission medications   Medication Sig Start Date End Date Taking? Authorizing Provider  albuterol (PROVENTIL HFA;VENTOLIN HFA) 108 (90 BASE) MCG/ACT inhaler Inhale 2 puffs into the lungs every 6 (six) hours as needed for wheezing. 02/15/13   Kathyrn Drown, MD  pantoprazole (PROTONIX) 40 MG tablet TAKE 1 TABLET (40 MG TOTAL) BY MOUTH DAILY. 12/17/15   Annitta Needs, NP  triamterene-hydrochlorothiazide (DYAZIDE) 37.5-25 MG capsule TAKE ONE CAPSULE BY MOUTH EVERY DAY 03/26/16   Kathyrn Drown, MD   Family History Family History  Problem Relation Age of Onset  . Colon cancer Neg Hx    Social History Social History  Substance Use Topics  . Smoking status: Never Smoker  . Smokeless tobacco: Never Used  . Alcohol use Yes     Comment: socially  Allergies   Ace inhibitors; Codeine; and Phenobarbital  Review of Systems Review of Systems  Musculoskeletal: Positive for myalgias.  Neurological: Negative for weakness and numbness.  All other systems reviewed and are negative.  Physical Exam Updated Vital Signs BP 144/87 (BP Location: Left Arm)   Pulse 76   Temp 98.7 F (37.1 C) (Oral)   Resp 16   Ht 5\' 4"  (1.626 m)   Wt 215 lb (97.5 kg)   SpO2 100%   BMI 36.90 kg/m   Physical Exam  Constitutional: She appears well-developed and well-nourished. No distress.  HENT:  Head: Normocephalic and atraumatic.  Eyes: Conjunctivae are normal.  Neck: Normal range of motion.    Cardiovascular: Normal rate.   Pulmonary/Chest: Effort normal.  Abdominal: She exhibits no distension.  Musculoskeletal: Normal range of motion.  Swelling to posterior popliteal of the left leg.   Neurological: She is alert.  Skin: No pallor.  Psychiatric: She has a normal mood and affect. Her behavior is normal.  Nursing note and vitals reviewed.  ED Treatments / Results  DIAGNOSTIC STUDIES: Oxygen Saturation is 100% on RA, normal by my interpretation.   COORDINATION OF CARE: 7:43 PM-Discussed next steps with pt. Pt verbalized understanding and is agreeable with the plan.   Labs (all labs ordered are listed, but only abnormal results are displayed) Labs Reviewed  D-DIMER, QUANTITATIVE (NOT AT [facility])   Radiology US Venous Img Lower Unilateral Right  Result Date: 09/27/2016 CLINICAL DATA:  Right leg pain and swelling EXAM: Right LOWER EXTREMITY VENOUS DOPPLER ULTRASOUND TECHNIQUE: Gray-scale sonography with graded compression, as well as color Doppler and duplex ultrasound were performed to evaluate the lower extremity deep venous systems from the level of the common femoral vein and including the common femoral, femoral, profunda femoral, popliteal and calf veins including the posterior tibial, peroneal and gastrocnemius veins when visible. The superficial great saphenous vein was also interrogated. Spectral Doppler was utilized to evaluate flow at rest and with distal augmentation maneuvers in the common femoral, femoral and popliteal veins. COMPARISON:  None. FINDINGS: Contralateral Common Femoral Vein: Respiratory phasicity is normal and symmetric with the symptomatic side. No evidence of thrombus. Normal compressibility. Common Femoral Vein: No evidence of thrombus. Normal compressibility, respiratory phasicity and response to augmentation. Saphenofemoral Junction: No evidence of thrombus. Normal compressibility and flow on color Doppler imaging. Profunda Femoral Vein: No evidence of  thrombus. Normal compressibility and flow on color Doppler imaging. Femoral Vein: No evidence of thrombus. Normal compressibility, respiratory phasicity and response to augmentation. Popliteal Vein: No evidence of thrombus. Normal compressibility, respiratory phasicity and response to augmentation. Calf Veins: No evidence of thrombus. Normal compressibility and flow on color Doppler imaging. Superficial Great Saphenous Vein: No evidence of thrombus. Normal compressibility and flow on color Doppler imaging. Venous Reflux:  None. Other Findings: In the right popliteal fossa, there is a C-shaped fluid collection identified consistent with Baker's cyst. IMPRESSION: No evidence of deep venous thrombosis. Popliteal cyst. Electronically Signed   By: Inez Catalina M.D.   On: 09/27/2016 18:29   Procedures Procedures  Medications Ordered in ED Medications - No data to display  Initial Impression / Assessment and Plan / ED Course  I have reviewed the triage vital signs and the nursing notes.  Pertinent labs & imaging results that were available during my care of the patient were reviewed by me and considered in my medical decision making (see chart for details).  Clinical Course      Final Clinical Impressions(s) /  ED Diagnoses   Final diagnoses:  Right leg swelling  Pain of right lower extremity  Pain and swelling of right lower leg   New Prescriptions Discharge Medication List as of 09/27/2016  7:44 PM    An After Visit Summary was printed and given to the patient.  I personally performed the services in this documentation, which was scribed in my presence.  The recorded information has been reviewed and considered.   Ronnald Collum.    Hollace Kinnier [place], PA-C 09/27/16 2214    Isla Pence, MD 09/27/16 [phone]

## 2016-09-27 NOTE — ED Triage Notes (Signed)
Pt reports pain in her R leg that started 1 month ago. Pt seen by PCP and sent for [place] and blood work.

## 2016-10-05 ENCOUNTER — Other Ambulatory Visit: Payer: Self-pay | Admitting: *Deleted

## 2016-10-05 NOTE — Telephone Encounter (Signed)
May have 90 day supply needs a standard follow-up visit within the next 3 months

## 2016-10-06 MED ORDER — TRIAMTERENE-HCTZ 37.5-25 MG PO CAPS: 1.0000 | ORAL_CAPSULE | Freq: Every day | ORAL | 0 refills | Status: DC

## 2016-12-28 ENCOUNTER — Other Ambulatory Visit: Payer: Self-pay | Admitting: Gastroenterology

## 2017-02-25 ENCOUNTER — Other Ambulatory Visit: Payer: Self-pay | Admitting: Family Medicine

## 2017-05-26 ENCOUNTER — Other Ambulatory Visit: Payer: Self-pay | Admitting: Family Medicine

## 2017-05-26 NOTE — Telephone Encounter (Signed)
Last seen November 2017 for leg swelling.

## 2017-05-26 NOTE — Telephone Encounter (Signed)
She may have 30 day with one refill needs office visit this fall

## 2017-06-22 ENCOUNTER — Other Ambulatory Visit: Payer: Self-pay | Admitting: Family Medicine

## 2017-06-22 NOTE — Telephone Encounter (Signed)
She may have a 30 day supply, send her a card stating she needs office visit before further refills

## 2017-06-22 NOTE — Telephone Encounter (Signed)
Last seen 09/27/16. May we refill?

## 2017-07-25 ENCOUNTER — Other Ambulatory Visit: Payer: Self-pay | Admitting: Family Medicine

## 2017-08-26 ENCOUNTER — Other Ambulatory Visit: Payer: Self-pay | Admitting: Family Medicine

## 2017-09-14 ENCOUNTER — Other Ambulatory Visit: Payer: Self-pay | Admitting: Family Medicine

## 2017-11-08 ENCOUNTER — Telehealth: Payer: Self-pay | Admitting: Family Medicine

## 2017-11-08 MED ORDER — TRIAMTERENE-HCTZ 37.5-25 MG PO CAPS: ORAL_CAPSULE | ORAL | 0 refills | Status: DC

## 2017-11-08 NOTE — Telephone Encounter (Signed)
Patient is aware we have sent in 30 days worth to last until appt.

## 2017-11-08 NOTE — Telephone Encounter (Signed)
Patient out of  BP meds:   triamterene-hydrochlorothiazide (DYAZIDE) 37.5-25 MG capsule  Hasn't been in the office since 2017.  She made an appt for 11-17-17 and asked for enough meds to last till then.  [facility] [place]

## 2017-11-17 ENCOUNTER — Ambulatory Visit: Payer: BC Managed Care – PPO | Admitting: Family Medicine

## 2017-11-17 ENCOUNTER — Encounter: Payer: Self-pay | Admitting: Family Medicine

## 2017-11-17 VITALS — BP 144/98 | Ht 64.0 in | Wt 210.0 lb

## 2017-11-17 DIAGNOSIS — M545 Low back pain, unspecified: Secondary | ICD-10-CM

## 2017-11-17 DIAGNOSIS — I10 Essential (primary) hypertension: Secondary | ICD-10-CM

## 2017-11-17 DIAGNOSIS — Z13220 Encounter for screening for lipoid disorders: Secondary | ICD-10-CM | POA: Diagnosis not present

## 2017-11-17 MED ORDER — PANTOPRAZOLE SODIUM 40 MG PO TBEC: 40.0000 mg | DELAYED_RELEASE_TABLET | Freq: Every day | ORAL | 3 refills | Status: DC

## 2017-11-17 MED ORDER — TRIAMTERENE-HCTZ 37.5-25 MG PO CAPS: ORAL_CAPSULE | ORAL | 1 refills | Status: DC

## 2017-11-17 NOTE — Patient Instructions (Signed)
DASH Eating Plan DASH stands for "Dietary Approaches to Stop Hypertension." The DASH eating plan is a healthy eating plan that has been shown to reduce high blood pressure (hypertension). It may also reduce your risk for type 2 diabetes, heart disease, and stroke. The DASH eating plan may also help with weight loss. What are tips for following this plan? General guidelines  Avoid eating more than 2,300 mg (milligrams) of salt (sodium) a day. If you have hypertension, you may need to reduce your sodium intake to 1,500 mg a day.  Limit alcohol intake to no more than 1 drink a day for nonpregnant women and 2 drinks a day for men. One drink equals 12 oz of beer, 5 oz of wine, or 1 oz of hard liquor.  Work with your health care provider to maintain a healthy body weight or to lose weight. Ask what an ideal weight is for you.  Get at least 30 minutes of exercise that causes your heart to beat faster (aerobic exercise) most days of the week. Activities may include walking, swimming, or biking.  Work with your health care provider or diet and nutrition specialist (dietitian) to adjust your eating plan to your individual calorie needs. Reading food labels  Check food labels for the amount of sodium per serving. Choose foods with less than 5 percent of the Daily Value of sodium. Generally, foods with less than 300 mg of sodium per serving fit into this eating plan.  To find whole grains, look for the word "whole" as the first word in the ingredient list. Shopping  Buy products labeled as "low-sodium" or "no salt added."  Buy fresh foods. Avoid canned foods and premade or frozen meals. Cooking  Avoid adding salt when cooking. Use salt-free seasonings or herbs instead of table salt or sea salt. Check with your health care provider or pharmacist before using salt substitutes.  Do not fry foods. Cook foods using healthy methods such as baking, boiling, grilling, and broiling instead.  Cook with  heart-healthy oils, such as olive, canola, soybean, or sunflower oil. Meal planning   Eat a balanced diet that includes: ? 5 or more servings of fruits and vegetables each day. At each meal, try to fill half of your plate with fruits and vegetables. ? Up to 6-8 servings of whole grains each day. ? Less than 6 oz of lean meat, poultry, or fish each day. A 3-oz serving of meat is about the same size as a deck of cards. One egg equals 1 oz. ? 2 servings of low-fat dairy each day. ? A serving of nuts, seeds, or beans 5 times each week. ? Heart-healthy fats. Healthy fats called Omega-3 fatty acids are found in foods such as flaxseeds and coldwater fish, like sardines, salmon, and mackerel.  Limit how much you eat of the following: ? Canned or prepackaged foods. ? Food that is high in trans fat, such as fried foods. ? Food that is high in saturated fat, such as fatty meat. ? Sweets, desserts, sugary drinks, and other foods with added sugar. ? Full-fat dairy products.  Do not salt foods before eating.  Try to eat at least 2 vegetarian meals each week.  Eat more home-cooked food and less restaurant, buffet, and fast food.  When eating at a restaurant, ask that your food be prepared with less salt or no salt, if possible. What foods are recommended? The items listed may not be a complete list. Talk with your dietitian about what   dietary choices are best for you. Grains Whole-grain or whole-wheat bread. Whole-grain or whole-wheat pasta. Brown rice. Oatmeal. Quinoa. Bulgur. Whole-grain and low-sodium cereals. Pita bread. Low-fat, low-sodium crackers. Whole-wheat flour tortillas. Vegetables Fresh or frozen vegetables (raw, steamed, roasted, or grilled). Low-sodium or reduced-sodium tomato and vegetable juice. Low-sodium or reduced-sodium tomato sauce and tomato paste. Low-sodium or reduced-sodium canned vegetables. Fruits All fresh, dried, or frozen fruit. Canned fruit in natural juice (without  added sugar). Meat and other protein foods Skinless chicken or turkey. Ground chicken or turkey. Pork with fat trimmed off. Fish and seafood. Egg whites. Dried beans, peas, or lentils. Unsalted nuts, nut butters, and seeds. Unsalted canned beans. Lean cuts of beef with fat trimmed off. Low-sodium, lean deli meat. Dairy Low-fat (1%) or fat-free (skim) milk. Fat-free, low-fat, or reduced-fat cheeses. Nonfat, low-sodium ricotta or cottage cheese. Low-fat or nonfat yogurt. Low-fat, low-sodium cheese. Fats and oils Soft margarine without trans fats. Vegetable oil. Low-fat, reduced-fat, or light mayonnaise and salad dressings (reduced-sodium). Canola, safflower, olive, soybean, and sunflower oils. Avocado. Seasoning and other foods Herbs. Spices. Seasoning mixes without salt. Unsalted popcorn and pretzels. Fat-free sweets. What foods are not recommended? The items listed may not be a complete list. Talk with your dietitian about what dietary choices are best for you. Grains Baked goods made with fat, such as croissants, muffins, or some breads. Dry pasta or rice meal packs. Vegetables Creamed or fried vegetables. Vegetables in a cheese sauce. Regular canned vegetables (not low-sodium or reduced-sodium). Regular canned tomato sauce and paste (not low-sodium or reduced-sodium). Regular tomato and vegetable juice (not low-sodium or reduced-sodium). Pickles. Olives. Fruits Canned fruit in a light or heavy syrup. Fried fruit. Fruit in cream or butter sauce. Meat and other protein foods Fatty cuts of meat. Ribs. Fried meat. Bacon. Sausage. Bologna and other processed lunch meats. Salami. Fatback. Hotdogs. Bratwurst. Salted nuts and seeds. Canned beans with added salt. Canned or smoked fish. Whole eggs or egg yolks. Chicken or turkey with skin. Dairy Whole or 2% milk, cream, and half-and-half. Whole or full-fat cream cheese. Whole-fat or sweetened yogurt. Full-fat cheese. Nondairy creamers. Whipped toppings.  Processed cheese and cheese spreads. Fats and oils Butter. Stick margarine. Lard. Shortening. Ghee. Bacon fat. Tropical oils, such as coconut, palm kernel, or palm oil. Seasoning and other foods Salted popcorn and pretzels. Onion salt, garlic salt, seasoned salt, table salt, and sea salt. Worcestershire sauce. Tartar sauce. Barbecue sauce. Teriyaki sauce. Soy sauce, including reduced-sodium. Steak sauce. Canned and packaged gravies. Fish sauce. Oyster sauce. Cocktail sauce. Horseradish that you find on the shelf. Ketchup. Mustard. Meat flavorings and tenderizers. Bouillon cubes. Hot sauce and Tabasco sauce. Premade or packaged marinades. Premade or packaged taco seasonings. Relishes. Regular salad dressings. Where to find more information:  National Heart, Lung, and Blood Institute: www.nhlbi.nih.gov  American Heart Association: www.heart.org Summary  The DASH eating plan is a healthy eating plan that has been shown to reduce high blood pressure (hypertension). It may also reduce your risk for type 2 diabetes, heart disease, and stroke.  With the DASH eating plan, you should limit salt (sodium) intake to 2,300 mg a day. If you have hypertension, you may need to reduce your sodium intake to 1,500 mg a day.  When on the DASH eating plan, aim to eat more fresh fruits and vegetables, whole grains, lean proteins, low-fat dairy, and heart-healthy fats.  Work with your health care provider or diet and nutrition specialist (dietitian) to adjust your eating plan to your individual   calorie needs. This information is not intended to replace advice given to you by your health care provider. Make sure you discuss any questions you have with your health care provider. Document Released: 09/23/2011 Document Revised: 09/27/2016 Document Reviewed: 09/27/2016 Elsevier Interactive Patient Education  2018 Elsevier Inc.  

## 2017-11-17 NOTE — Progress Notes (Signed)
   Subjective:    Patient ID: Beth Mayo, female    DOB: [birth year], 67 y.o.   MRN: [id number]  Hypertension  This is a chronic problem. Pertinent negatives include no chest pain, headaches or shortness of breath. Compliance problems: was exercising every day. stopped due to back pain.    Patient for blood pressure check up. Patient relates compliance with meds. Todays BP reviewed with the patient. Patient denies issues with medication. Patient relates reasonable diet. Patient tries to minimize salt. Patient aware of BP goals.  burning in back off and on for a few months.  Denies any radiation down the legs does keep her from exercising she denies it waking her up at night denies any injury to cause it Would like to start getting prontonix prescribed by pcp instead of specialist.    Review of Systems  Constitutional: Negative for activity change, fatigue and fever.  HENT: Negative for congestion.   Respiratory: Negative for cough, chest tightness and shortness of breath.   Cardiovascular: Negative for chest pain and leg swelling.  Gastrointestinal: Negative for abdominal pain.  Musculoskeletal: Positive for back pain.  Skin: Negative for color change.  Neurological: Negative for headaches.  Psychiatric/Behavioral: Negative for behavioral problems.       Objective:   Physical Exam  Constitutional: She appears well-developed and well-nourished. No distress.  HENT:  Head: Normocephalic and atraumatic.  Eyes: Right eye exhibits no discharge. Left eye exhibits no discharge.  Neck: No tracheal deviation present.  Cardiovascular: Normal rate, regular rhythm and normal heart sounds.  No murmur heard. Pulmonary/Chest: Effort normal and breath sounds normal. No respiratory distress. She has no wheezes. She has no rales.  Musculoskeletal: She exhibits no edema.  Lymphadenopathy:    She has no cervical adenopathy.  Neurological: She is alert. She exhibits normal muscle tone.  Skin:  Skin is warm and dry. No erythema.  Psychiatric: Her behavior is normal.  Vitals reviewed.  Negative straight leg raise       Assessment & Plan:  HTN- Patient was seen today as part of a visit regarding hypertension. The importance of healthy diet and regular physical activity was discussed. The importance of compliance with medications discussed. Ideal goal is to keep blood pressure low elevated levels certainly below 878/67 when possible. The patient was counseled that keeping blood pressure under control lessen his risk of heart attack, stroke, kidney failure, and early death. The importance of regular follow-ups was discussed with the patient. Low-salt diet such as DASH recommended. Regular physical activity was recommended as well. Patient was advised to keep regular follow-ups.  Blood pressure not at goal very important to get at goal follow-up next week for courtesy blood pressure check  Low back pain exercises shown if ongoing trouble will need further intervention no need for blood x-rays or lab work currently patient may resume exercise once she is feeling up to it if trouble does not get better over the next few weeks she needs a follow-up-I do not feel there is any underlying serious issue further troubles may need imaging  Lab work indicated for lab/cholesterol issues

## 2017-11-25 LAB — LIPID PANEL
CHOL/HDL RATIO: 4.7 ratio — AB (ref 0.0–4.4)
Cholesterol, Total: 198 mg/dL (ref 100–199)
HDL: 42 mg/dL (ref >39)
LDL Calculated: 118 mg/dL — ABNORMAL HIGH (ref 0–99)
Triglycerides: 190 mg/dL — ABNORMAL HIGH (ref 0–149)
VLDL CHOLESTEROL CAL: 38 mg/dL (ref 5–40)

## 2017-11-25 LAB — BASIC METABOLIC PANEL
BUN / CREAT RATIO: 10 — AB (ref 12–28)
BUN: 10 mg/dL (ref 8–27)
CHLORIDE: 100 mmol/L (ref 96–106)
CO2: 28 mmol/L (ref 20–29)
Calcium: 9.7 mg/dL (ref 8.7–10.3)
Creatinine, Ser: 0.96 mg/dL (ref 0.57–1.00)
GFR calc Af Amer: 72 mL/min/{1.73_m2} (ref >59)
GFR calc non Af Amer: 63 mL/min/{1.73_m2} (ref >59)
GLUCOSE: 121 mg/dL — AB (ref 65–99)
POTASSIUM: 4.1 mmol/L (ref 3.5–5.2)
SODIUM: 143 mmol/L (ref 134–144)

## 2017-11-29 ENCOUNTER — Encounter: Payer: Self-pay | Admitting: Family Medicine

## 2017-11-29 ENCOUNTER — Ambulatory Visit: Payer: BC Managed Care – PPO | Admitting: Family Medicine

## 2017-11-29 VITALS — BP 154/88 | Ht 64.0 in | Wt 209.0 lb

## 2017-11-29 DIAGNOSIS — I10 Essential (primary) hypertension: Secondary | ICD-10-CM

## 2017-11-29 DIAGNOSIS — R7303 Prediabetes: Secondary | ICD-10-CM

## 2017-11-29 DIAGNOSIS — Z79899 Other long term (current) drug therapy: Secondary | ICD-10-CM | POA: Diagnosis not present

## 2017-11-29 DIAGNOSIS — E7849 Other hyperlipidemia: Secondary | ICD-10-CM

## 2017-11-29 LAB — POCT GLYCOSYLATED HEMOGLOBIN (HGB A1C): Hemoglobin A1C: 5.6

## 2017-11-29 MED ORDER — PRAVASTATIN SODIUM 20 MG PO TABS: 20.0000 mg | ORAL_TABLET | Freq: Every day | ORAL | 5 refills | Status: DC

## 2017-11-29 NOTE — Progress Notes (Signed)
   Subjective:    Patient ID: Beth Mayo, female    DOB: 09-Jul-1951, 67 y.o.   MRN: [id number]  HPI  Patient is here today to discuss starting cholesterol medications and to check a1c.  Patient does not always watch her diet she does not exercise on a regular basis she denies any chest tightness pressure pain she states she does have some family history of cholesterol and sugar issues  Review of Systems  Constitutional: Negative for activity change, fatigue and fever.  HENT: Negative for congestion.   Respiratory: Negative for cough, chest tightness and shortness of breath.   Cardiovascular: Negative for chest pain and leg swelling.  Gastrointestinal: Negative for abdominal pain.  Skin: Negative for color change.  Neurological: Negative for headaches.  Psychiatric/Behavioral: Negative for behavioral problems.       Results for orders placed or performed in visit on 11/29/17  POCT glycosylated hemoglobin (Hb A1C)  Result Value Ref Range   Hemoglobin A1C 5.6     Objective:   Physical Exam  Constitutional: She appears well-developed and well-nourished. No distress.  HENT:  Head: Normocephalic and atraumatic.  Eyes: Right eye exhibits no discharge. Left eye exhibits no discharge.  Neck: No tracheal deviation present.  Cardiovascular: Normal rate, regular rhythm and normal heart sounds.  No murmur heard. Pulmonary/Chest: Effort normal and breath sounds normal. No respiratory distress. She has no wheezes. She has no rales.  Musculoskeletal: She exhibits no edema.  Lymphadenopathy:    She has no cervical adenopathy.  Neurological: She is alert. She exhibits normal muscle tone.  Skin: Skin is warm and dry. No erythema.  Psychiatric: Her behavior is normal.  Vitals reviewed.  Pronounced given to the patient regarding diet and exercise   15 minutes was spent with patient today discussing healthcare issues which they came. Greater than half of the discussion was answering  questions and giving management guidance regarding the diagnosis for which the patient came.  Please see diagnosis discuss     Assessment & Plan:  Prediabetes-importance of losing weight exercise limiting portions healthier diet printout given hold off on medications currently  Blood pressure good control currently continue current measures down the road may need to switch medicines because of sugar issue  Hyperlipidemia patient at increased risk of heart attack at least 9.7% risk recommend starting statin 20 mg she will take Monday Wednesday Friday for the first 2 weeks then 1 daily thereafter she will repeat lipid liver in 8-10 weeks.  She will do a comprehensive visit within 6 months

## 2017-11-29 NOTE — Patient Instructions (Signed)
Diabetes Mellitus and Nutrition When you have diabetes (diabetes mellitus), it is very important to have healthy eating habits because your blood sugar (glucose) levels are greatly affected by what you eat and drink. Eating healthy foods in the appropriate amounts, at about the same times every day, can help you:  Control your blood glucose.  Lower your risk of heart disease.  Improve your blood pressure.  Reach or maintain a healthy weight.  Every person with diabetes is different, and each person has different needs for a meal plan. Your health care provider may recommend that you work with a diet and nutrition specialist (dietitian) to make a meal plan that is best for you. Your meal plan may vary depending on factors such as:  The calories you need.  The medicines you take.  Your weight.  Your blood glucose, blood pressure, and cholesterol levels.  Your activity level.  Other health conditions you have, such as heart or kidney disease.  How do carbohydrates affect me? Carbohydrates affect your blood glucose level more than any other type of food. Eating carbohydrates naturally increases the amount of glucose in your blood. Carbohydrate counting is a method for keeping track of how many carbohydrates you eat. Counting carbohydrates is important to keep your blood glucose at a healthy level, especially if you use insulin or take certain oral diabetes medicines. It is important to know how many carbohydrates you can safely have in each meal. This is different for every person. Your dietitian can help you calculate how many carbohydrates you should have at each meal and for snack. Foods that contain carbohydrates include:  Bread, cereal, rice, pasta, and crackers.  Potatoes and corn.  Peas, beans, and lentils.  Milk and yogurt.  Fruit and juice.  Desserts, such as cakes, cookies, ice cream, and candy.  How does alcohol affect me? Alcohol can cause a sudden decrease in blood  glucose (hypoglycemia), especially if you use insulin or take certain oral diabetes medicines. Hypoglycemia can be a life-threatening condition. Symptoms of hypoglycemia (sleepiness, dizziness, and confusion) are similar to symptoms of having too much alcohol. If your health care provider says that alcohol is safe for you, follow these guidelines:  Limit alcohol intake to no more than 1 drink per day for nonpregnant women and 2 drinks per day for men. One drink equals 12 oz of beer, 5 oz of wine, or 1 oz of hard liquor.  Do not drink on an empty stomach.  Keep yourself hydrated with water, diet soda, or unsweetened iced tea.  Keep in mind that regular soda, juice, and other mixers may contain a lot of sugar and must be counted as carbohydrates.  What are tips for following this plan? Reading food labels  Start by checking the serving size on the label. The amount of calories, carbohydrates, fats, and other nutrients listed on the label are based on one serving of the food. Many foods contain more than one serving per package.  Check the total grams (g) of carbohydrates in one serving. You can calculate the number of servings of carbohydrates in one serving by dividing the total carbohydrates by 15. For example, if a food has 30 g of total carbohydrates, it would be equal to 2 servings of carbohydrates.  Check the number of grams (g) of saturated and trans fats in one serving. Choose foods that have low or no amount of these fats.  Check the number of milligrams (mg) of sodium in one serving. Most people   should limit total sodium intake to less than 2,300 mg per day.  Always check the nutrition information of foods labeled as "low-fat" or "nonfat". These foods may be higher in added sugar or refined carbohydrates and should be avoided.  Talk to your dietitian to identify your daily goals for nutrients listed on the label. Shopping  Avoid buying canned, premade, or processed foods. These  foods tend to be high in fat, sodium, and added sugar.  Shop around the outside edge of the grocery store. This includes fresh fruits and vegetables, bulk grains, fresh meats, and fresh dairy. Cooking  Use low-heat cooking methods, such as baking, instead of high-heat cooking methods like deep frying.  Cook using healthy oils, such as olive, canola, or sunflower oil.  Avoid cooking with butter, cream, or high-fat meats. Meal planning  Eat meals and snacks regularly, preferably at the same times every day. Avoid going long periods of time without eating.  Eat foods high in fiber, such as fresh fruits, vegetables, beans, and whole grains. Talk to your dietitian about how many servings of carbohydrates you can eat at each meal.  Eat 4-6 ounces of lean protein each day, such as lean meat, chicken, fish, eggs, or tofu. 1 ounce is equal to 1 ounce of meat, chicken, or fish, 1 egg, or 1/4 cup of tofu.  Eat some foods each day that contain healthy fats, such as avocado, nuts, seeds, and fish. Lifestyle   Check your blood glucose regularly.  Exercise at least 30 minutes 5 or more days each week, or as told by your health care provider.  Take medicines as told by your health care provider.  Do not use any products that contain nicotine or tobacco, such as cigarettes and e-cigarettes. If you need help quitting, ask your health care provider.  Work with a counselor or diabetes educator to identify strategies to manage stress and any emotional and social challenges. What are some questions to ask my health care provider?  Do I need to meet with a diabetes educator?  Do I need to meet with a dietitian?  What number can I call if I have questions?  When are the best times to check my blood glucose? Where to find more information:  American Diabetes Association: diabetes.org/food-and-fitness/food  Academy of Nutrition and Dietetics:  www.eatright.org/resources/health/diseases-and-conditions/diabetes  National Institute of Diabetes and Digestive and Kidney Diseases (NIH): www.niddk.nih.gov/health-information/diabetes/overview/diet-eating-physical-activity Summary  A healthy meal plan will help you control your blood glucose and maintain a healthy lifestyle.  Working with a diet and nutrition specialist (dietitian) can help you make a meal plan that is best for you.  Keep in mind that carbohydrates and alcohol have immediate effects on your blood glucose levels. It is important to count carbohydrates and to use alcohol carefully. This information is not intended to replace advice given to you by your health care provider. Make sure you discuss any questions you have with your health care provider. Document Released: 07/01/2005 Document Revised: 11/08/2016 Document Reviewed: 11/08/2016 Elsevier Interactive Patient Education  2018 Elsevier Inc.  

## 2017-12-13 ENCOUNTER — Encounter: Payer: Self-pay | Admitting: Gastroenterology

## 2018-05-31 ENCOUNTER — Other Ambulatory Visit: Payer: Self-pay | Admitting: Family Medicine

## 2018-06-28 ENCOUNTER — Other Ambulatory Visit: Payer: Self-pay | Admitting: Family Medicine

## 2018-07-25 ENCOUNTER — Other Ambulatory Visit: Payer: Self-pay | Admitting: Family Medicine

## 2018-07-27 ENCOUNTER — Other Ambulatory Visit: Payer: Self-pay | Admitting: Family Medicine

## 2018-07-28 NOTE — Telephone Encounter (Signed)
May do a 30-day prescription Notify the pharmacy the patient needs to schedule office visit and lab work in order for [place] to do 90-day

## 2018-07-31 MED ORDER — PRAVASTATIN SODIUM 20 MG PO TABS: 20.0000 mg | ORAL_TABLET | Freq: Every day | ORAL | 0 refills | Status: DC

## 2018-08-08 IMAGING — US US EXTREM LOW VENOUS*R*
1 series · 13 of 24 positions shown · non-contrast
Comparison: None.

CLINICAL DATA: Right leg pain and swelling



[Series 1: us extrem low venous*right* · 0.08mm/px · 13 of 63 slices shown]
[im 1/63]
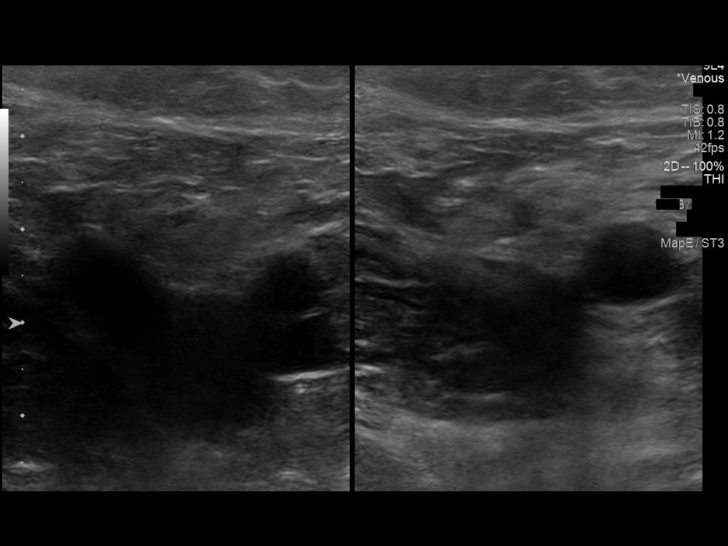
[im 6/63]
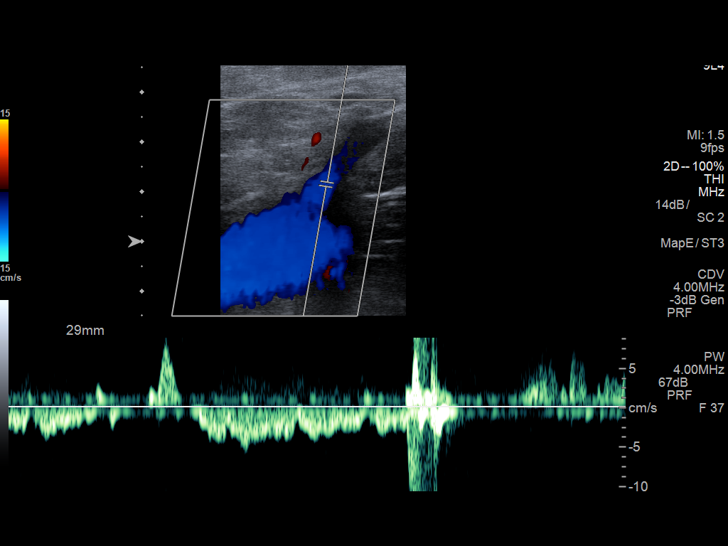
[im 11/63]
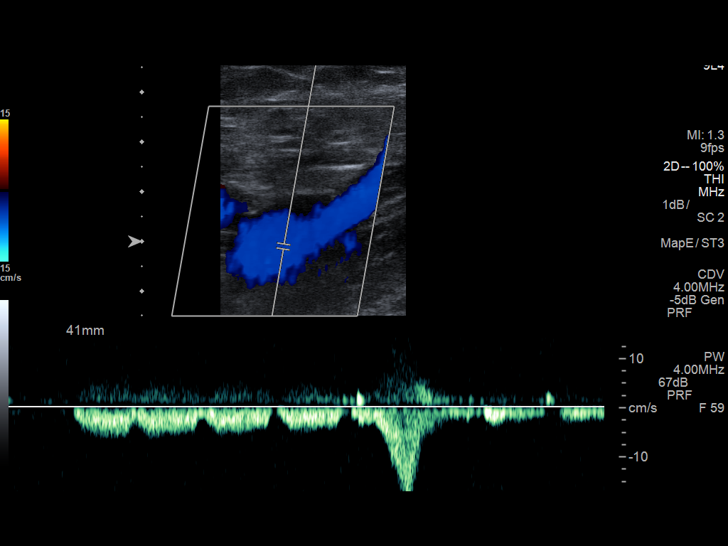
[im 17/63]
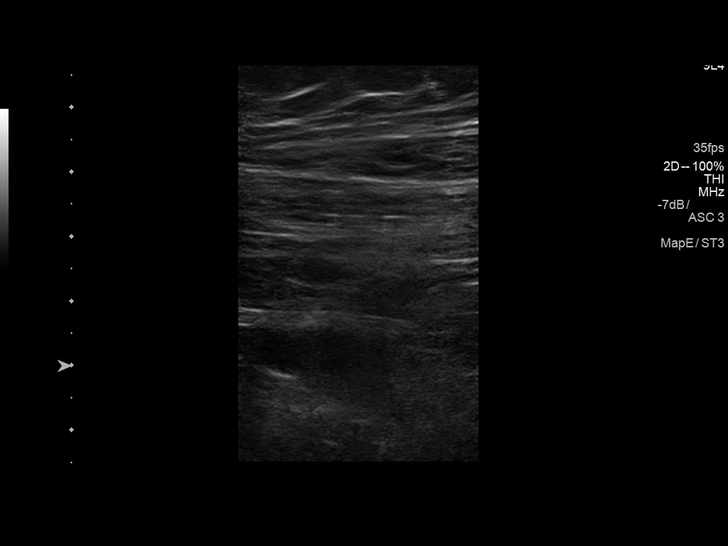
[im 22/63]
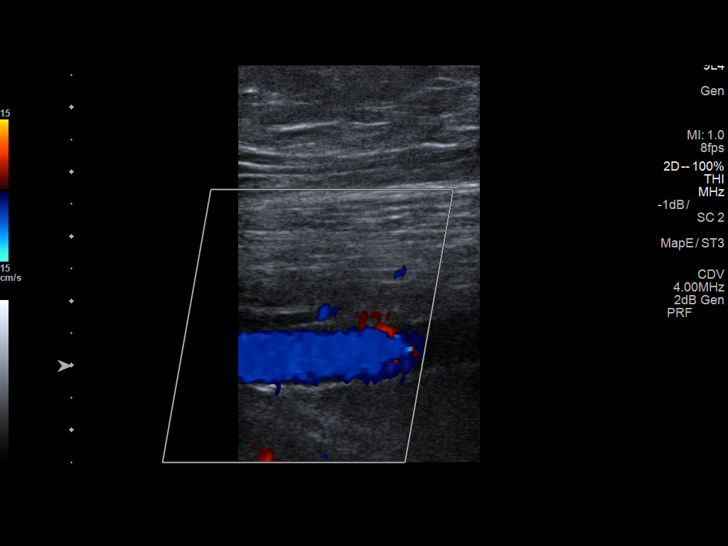
[im 27/63]
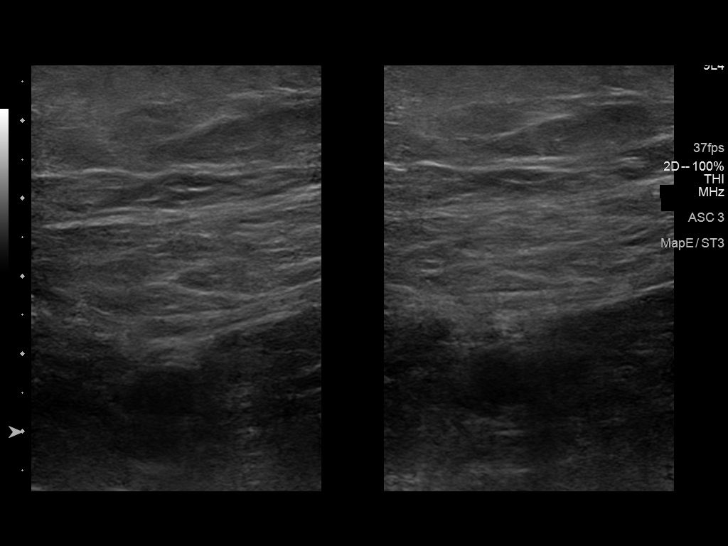
[im 33/63]
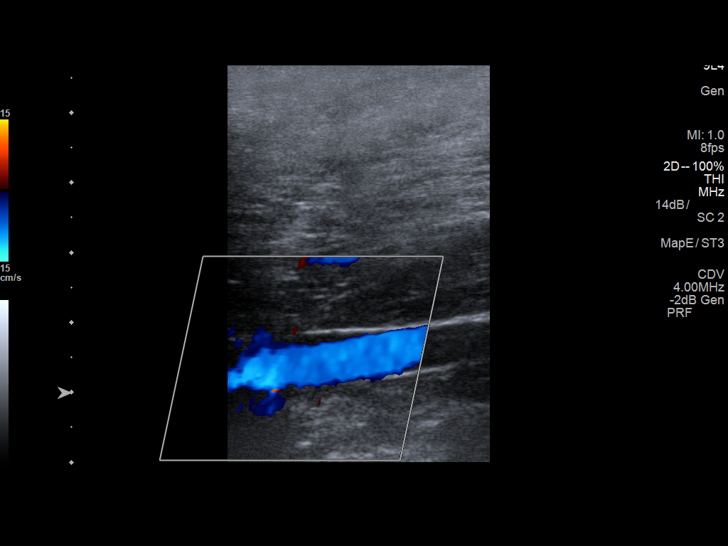
[im 36/63]
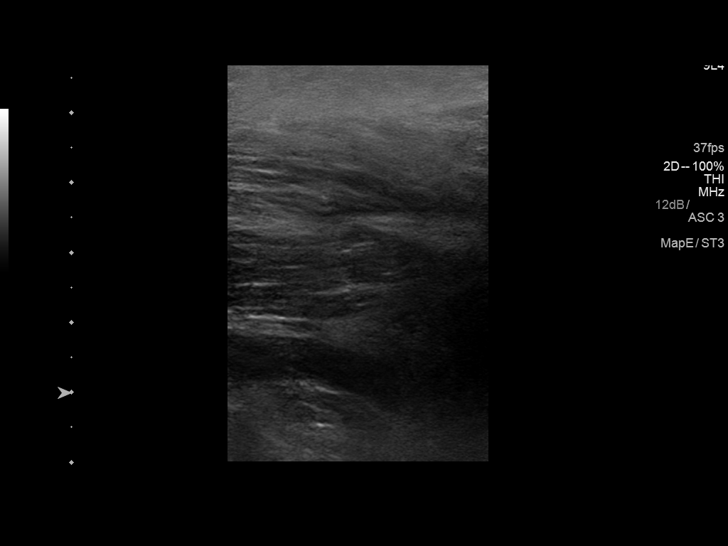
[im 41/63]
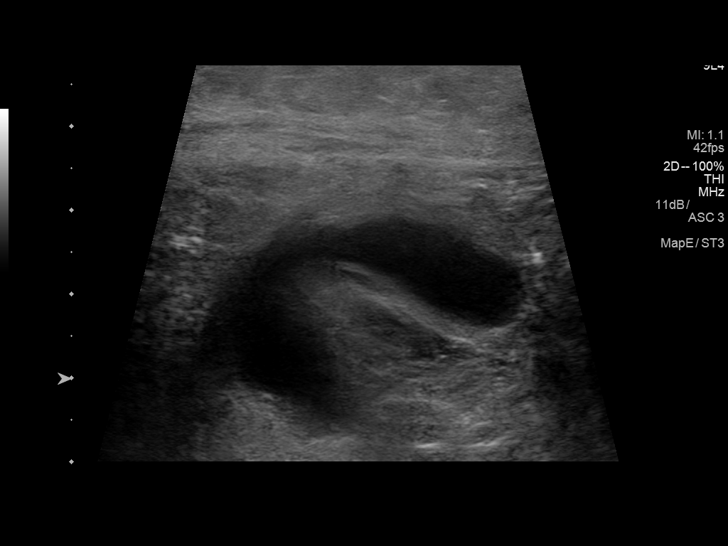
[im 46/63]
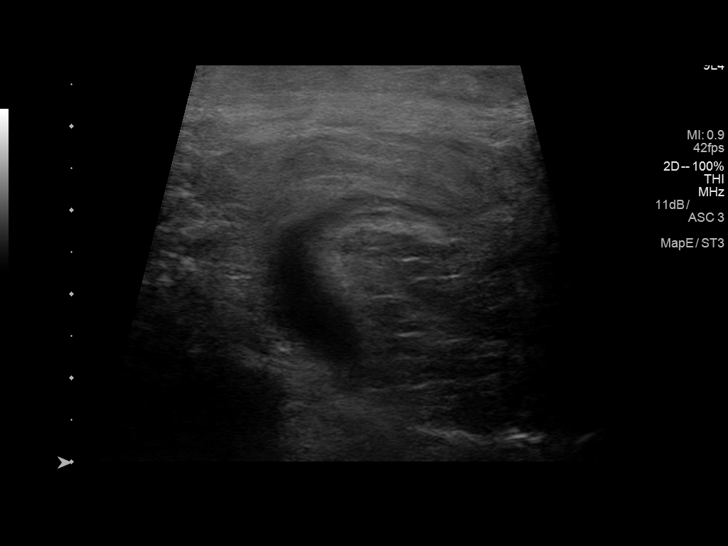
[im 52/63]
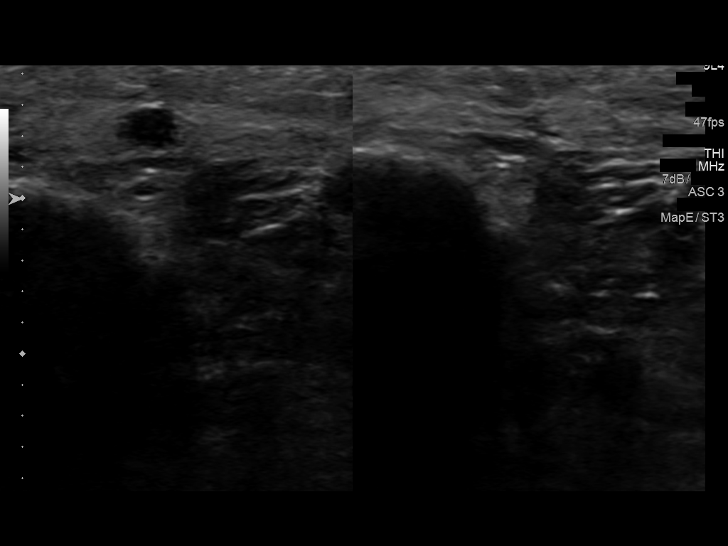
[im 57/63]
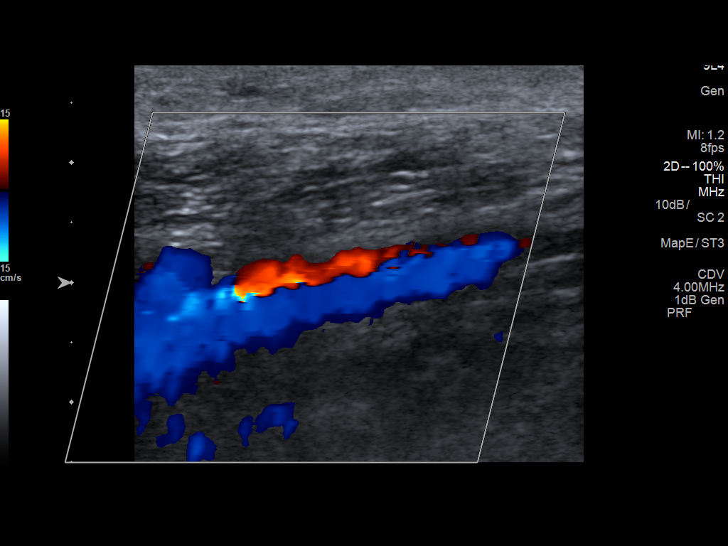
[im 63/63]
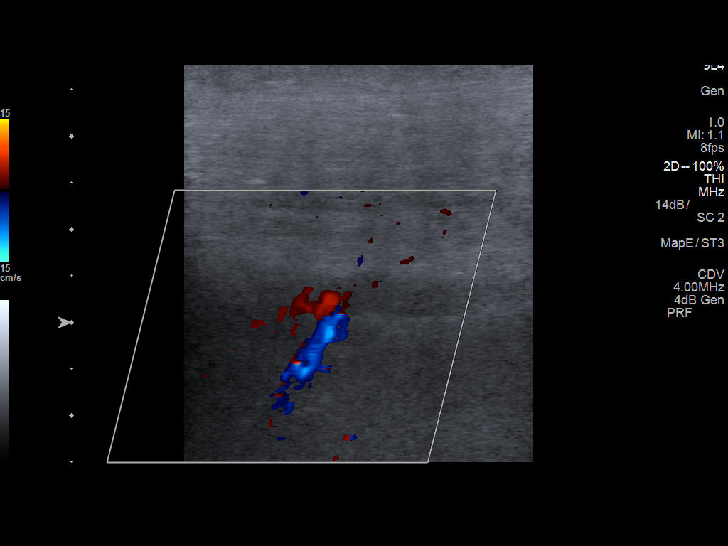

[13 of 24 positions shown; findings below may reference images not displayed]

FINDINGS: Contralateral Common Femoral Vein: Respiratory phasicity is normal
and symmetric with the symptomatic side. No evidence of thrombus.
Normal compressibility.

Common Femoral Vein: No evidence of thrombus. Normal
compressibility, respiratory phasicity and response to augmentation.

Saphenofemoral Junction: No evidence of thrombus. Normal
compressibility and flow on color Doppler imaging.

Profunda Femoral Vein: No evidence of thrombus. Normal
compressibility and flow on color Doppler imaging.

Femoral Vein: No evidence of thrombus. Normal compressibility,
respiratory phasicity and response to augmentation.

Popliteal Vein: No evidence of thrombus. Normal compressibility,
respiratory phasicity and response to augmentation.

Calf Veins: No evidence of thrombus. Normal compressibility and flow
on color Doppler imaging.

Superficial Great Saphenous Vein: No evidence of thrombus. Normal
compressibility and flow on color Doppler imaging.

Venous Reflux:  None.

Other Findings: In the right popliteal fossa, there is a C-shaped
fluid collection identified consistent with Baker's cyst.
IMPRESSION: No evidence of deep venous thrombosis.

Popliteal cyst

## 2018-08-14 ENCOUNTER — Other Ambulatory Visit: Payer: Self-pay | Admitting: Family Medicine

## 2018-08-15 MED ORDER — PRAVASTATIN SODIUM 20 MG PO TABS: 20.0000 mg | ORAL_TABLET | Freq: Every day | ORAL | 0 refills | Status: DC

## 2018-08-18 ENCOUNTER — Other Ambulatory Visit: Payer: Self-pay | Admitting: Family Medicine

## 2018-09-13 ENCOUNTER — Other Ambulatory Visit: Payer: Self-pay | Admitting: Family Medicine

## 2018-10-10 ENCOUNTER — Other Ambulatory Visit: Payer: Self-pay | Admitting: Family Medicine

## 2018-11-07 ENCOUNTER — Other Ambulatory Visit: Payer: Self-pay | Admitting: Family Medicine

## 2018-11-12 ENCOUNTER — Telehealth: Payer: Self-pay | Admitting: Family Medicine

## 2018-11-12 DIAGNOSIS — I10 Essential (primary) hypertension: Secondary | ICD-10-CM

## 2018-11-12 DIAGNOSIS — Z1159 Encounter for screening for other viral diseases: Secondary | ICD-10-CM

## 2018-11-12 DIAGNOSIS — Z114 Encounter for screening for human immunodeficiency virus [HIV]: Secondary | ICD-10-CM

## 2018-11-12 DIAGNOSIS — Z79899 Other long term (current) drug therapy: Secondary | ICD-10-CM

## 2018-11-12 DIAGNOSIS — R7303 Prediabetes: Secondary | ICD-10-CM

## 2018-11-12 DIAGNOSIS — E7849 Other hyperlipidemia: Secondary | ICD-10-CM

## 2018-11-12 DIAGNOSIS — Z13220 Encounter for screening for lipoid disorders: Secondary | ICD-10-CM

## 2018-11-12 NOTE — Telephone Encounter (Signed)
Nurses We received a copy of a test that the patient did She did a FIT test which is a screening test for blood It was positive I recommend referral to gastroenterology of her choice so they can do further work-up of this.  More than likely they will recommend colonoscopy to rule out cancer  Also this patient needs office visit to be scheduled up within 30 days plus also we can schedule her lab work to be done before that visit as well

## 2018-11-13 ENCOUNTER — Other Ambulatory Visit: Payer: Self-pay | Admitting: Family Medicine

## 2018-11-13 DIAGNOSIS — R195 Other fecal abnormalities: Secondary | ICD-10-CM

## 2018-11-13 NOTE — Telephone Encounter (Signed)
Orders placed and the pt is aware.

## 2018-11-13 NOTE — Addendum Note (Signed)
Addended by: Karle Barr on: 11/13/2018 10:29 AM   Modules accepted: Orders

## 2018-11-13 NOTE — Addendum Note (Signed)
Addended by: Karle Barr on: 11/13/2018 10:24 AM   Modules accepted: Orders

## 2018-11-13 NOTE — Telephone Encounter (Signed)
Lipid, liver, metabolic 7, D9M-EQA hyperlipidemia prediabetes Also hep C antibody, HIV antibody one-time testing per CDC guideline

## 2018-11-13 NOTE — Telephone Encounter (Signed)
Contacted patient and informed patient that provider would recommend referral to GI due to positive FIT test. Pt states that she has seen Dr.Fields. Referral placed to Dr.Fields. pt also transferred up front to schedule office visit. Last labs were hepatic and lipid. Please advise. Thank you

## 2018-11-15 ENCOUNTER — Encounter: Payer: Self-pay | Admitting: Family Medicine

## 2018-11-15 LAB — HEMOGLOBIN A1C
Est. average glucose Bld gHb Est-mCnc: 146 mg/dL
Hgb A1c MFr Bld: 6.7 % — ABNORMAL HIGH (ref 4.8–5.6)

## 2018-11-15 LAB — BASIC METABOLIC PANEL
BUN/Creatinine Ratio: 15 (ref 12–28)
BUN: 15 mg/dL (ref 8–27)
CHLORIDE: 100 mmol/L (ref 96–106)
CO2: 27 mmol/L (ref 20–29)
Calcium: 10 mg/dL (ref 8.7–10.3)
Creatinine, Ser: 0.97 mg/dL (ref 0.57–1.00)
GFR calc non Af Amer: 61 mL/min/{1.73_m2} (ref >59)
GFR, EST AFRICAN AMERICAN: 71 mL/min/{1.73_m2} (ref >59)
Glucose: 121 mg/dL — ABNORMAL HIGH (ref 65–99)
Potassium: 4.5 mmol/L (ref 3.5–5.2)
Sodium: 144 mmol/L (ref 134–144)

## 2018-11-15 LAB — LIPID PANEL
Chol/HDL Ratio: 4.3 ratio (ref 0.0–4.4)
Cholesterol, Total: 181 mg/dL (ref 100–199)
HDL: 42 mg/dL (ref >39)
LDL Calculated: 95 mg/dL (ref 0–99)
Triglycerides: 218 mg/dL — ABNORMAL HIGH (ref 0–149)
VLDL Cholesterol Cal: 44 mg/dL — ABNORMAL HIGH (ref 5–40)

## 2018-11-15 LAB — HEPATIC FUNCTION PANEL
ALT: 16 IU/L (ref 0–32)
AST: 14 IU/L (ref 0–40)
Albumin: 4.5 g/dL (ref 3.8–4.8)
Alkaline Phosphatase: 102 IU/L (ref 39–117)
Bilirubin Total: 0.4 mg/dL (ref 0.0–1.2)
Bilirubin, Direct: 0.11 mg/dL (ref 0.00–0.40)
Total Protein: 7.1 g/dL (ref 6.0–8.5)

## 2018-11-15 LAB — HIV ANTIBODY (ROUTINE TESTING W REFLEX): HIV Screen 4th Generation wRfx: NONREACTIVE

## 2018-11-15 LAB — HEPATITIS C ANTIBODY: Hep C Virus Ab: <0.1 s/co ratio (ref 0.0–0.9)

## 2018-11-16 ENCOUNTER — Encounter: Payer: Self-pay | Admitting: Family Medicine

## 2018-11-20 ENCOUNTER — Encounter: Payer: Self-pay | Admitting: Gastroenterology

## 2018-11-28 ENCOUNTER — Encounter: Payer: Self-pay | Admitting: Family Medicine

## 2018-11-28 ENCOUNTER — Ambulatory Visit: Payer: Medicare Other | Admitting: Family Medicine

## 2018-11-28 VITALS — BP 138/86 | Ht 64.0 in | Wt 212.0 lb

## 2018-11-28 DIAGNOSIS — Z23 Encounter for immunization: Secondary | ICD-10-CM

## 2018-11-28 DIAGNOSIS — I10 Essential (primary) hypertension: Secondary | ICD-10-CM | POA: Diagnosis not present

## 2018-11-28 DIAGNOSIS — Z780 Asymptomatic menopausal state: Secondary | ICD-10-CM | POA: Diagnosis not present

## 2018-11-28 DIAGNOSIS — E119 Type 2 diabetes mellitus without complications: Secondary | ICD-10-CM | POA: Diagnosis not present

## 2018-11-28 DIAGNOSIS — E7849 Other hyperlipidemia: Secondary | ICD-10-CM

## 2018-11-28 MED ORDER — PANTOPRAZOLE SODIUM 40 MG PO TBEC: 40.0000 mg | DELAYED_RELEASE_TABLET | Freq: Every day | ORAL | 1 refills | Status: DC

## 2018-11-28 MED ORDER — TRIAMTERENE-HCTZ 37.5-25 MG PO CAPS: 1.0000 | ORAL_CAPSULE | Freq: Every day | ORAL | 1 refills | Status: DC

## 2018-11-28 NOTE — Progress Notes (Signed)
   Subjective:    Patient ID: Beth Mayo, female    DOB: 1951-09-05, 68 y.o.   MRN: [id number]  Hypertension  This is a chronic problem. The current episode started more than 1 year ago. Pertinent negatives include no chest pain, headaches or shortness of breath. Risk factors for coronary artery disease include dyslipidemia and post-menopausal state. Treatments tried: dyazide. There are no compliance problems.    Patient has not taken the cholesterol medication Patient feels that her lab work is out of [facility] because of dietary new exercise so therefore she is [facility] and she states she is trying to watch her diet and try to lose weight.   Review of Systems  Constitutional: Negative for activity change, fatigue and fever.  HENT: Negative for congestion and rhinorrhea.   Respiratory: Negative for cough, chest tightness and shortness of breath.   Cardiovascular: Negative for chest pain and leg swelling.  Gastrointestinal: Negative for abdominal pain and nausea.  Skin: Negative for color change.  Neurological: Negative for dizziness and headaches.  Psychiatric/Behavioral: Negative for agitation and behavioral problems.       Objective:   Physical Exam Vitals signs reviewed.  Constitutional:      General: She is not in acute distress. HENT:     Head: Normocephalic and atraumatic.  Eyes:     General:        Right eye: No discharge.        Left eye: No discharge.  Neck:     Trachea: No tracheal deviation.  Cardiovascular:     Rate and Rhythm: Normal rate and regular rhythm.     Heart sounds: Normal heart sounds. No murmur.  Pulmonary:     Effort: Pulmonary effort is normal. No respiratory distress.     Breath sounds: Normal breath sounds.  Lymphadenopathy:     Cervical: No cervical adenopathy.  Skin:    General: Skin is warm and dry.  Neurological:     Mental Status: She is alert.     Coordination: Coordination normal.  Psychiatric:      Behavior: Behavior normal.     Patient will be having her colonoscopy in April she states her recent positive stool test for blood was because of hemorrhoids  Patient was encouraged to get mammogram completed.  Patient defers on flu vaccine     Assessment & Plan:  Based on her lab work the patient should be on a cholesterol medicine blood pressure medicine as well as diabetes medicine Patient does not want to be on those medicines currently she will continue her blood pressure medicine She states that she will devote herself to exercise low-salt diet and regular activity and she plans on rechecking these labs again in approximately 4 to 5 months with follow-up office visit  Pneumonia vaccine today

## 2018-11-28 NOTE — Patient Instructions (Signed)
DASH Eating Plan DASH stands for "Dietary Approaches to Stop Hypertension." The DASH eating plan is a healthy eating plan that has been shown to reduce high blood pressure (hypertension). It may also reduce your risk for type 2 diabetes, heart disease, and stroke. The DASH eating plan may also help with weight loss. What are tips for following this plan?  General guidelines  Avoid eating more than 2,300 mg (milligrams) of salt (sodium) a day. If you have hypertension, you may need to reduce your sodium intake to 1,500 mg a day.  Limit alcohol intake to no more than 1 drink a day for nonpregnant women and 2 drinks a day for men. One drink equals 12 oz of beer, 5 oz of wine, or 1 oz of hard liquor.  Work with your health care provider to maintain a healthy body weight or to lose weight. Ask what an ideal weight is for you.  Get at least 30 minutes of exercise that causes your heart to beat faster (aerobic exercise) most days of the week. Activities may include walking, swimming, or biking.  Work with your health care provider or diet and nutrition specialist (dietitian) to adjust your eating plan to your individual calorie needs. Reading food labels   Check food labels for the amount of sodium per serving. Choose foods with less than 5 percent of the Daily Value of sodium. Generally, foods with less than 300 mg of sodium per serving fit into this eating plan.  To find whole grains, look for the word "whole" as the first word in the ingredient list. Shopping  Buy products labeled as "low-sodium" or "no salt added."  Buy fresh foods. Avoid canned foods and premade or frozen meals. Cooking  Avoid adding salt when cooking. Use salt-free seasonings or herbs instead of table salt or sea salt. Check with your health care provider or pharmacist before using salt substitutes.  Do not fry foods. Cook foods using healthy methods such as baking, boiling, grilling, and broiling instead.  Cook with  heart-healthy oils, such as olive, canola, soybean, or sunflower oil. Meal planning  Eat a balanced diet that includes: ? 5 or more servings of fruits and vegetables each day. At each meal, try to fill half of your plate with fruits and vegetables. ? Up to 6-8 servings of whole grains each day. ? Less than 6 oz of lean meat, poultry, or fish each day. A 3-oz serving of meat is about the same size as a deck of cards. One egg equals 1 oz. ? 2 servings of low-fat dairy each day. ? A serving of nuts, seeds, or beans 5 times each week. ? Heart-healthy fats. Healthy fats called Omega-3 fatty acids are found in foods such as flaxseeds and coldwater fish, like sardines, salmon, and mackerel.  Limit how much you eat of the following: ? Canned or prepackaged foods. ? Food that is high in trans fat, such as fried foods. ? Food that is high in saturated fat, such as fatty meat. ? Sweets, desserts, sugary drinks, and other foods with added sugar. ? Full-fat dairy products.  Do not salt foods before eating.  Try to eat at least 2 vegetarian meals each week.  Eat more home-cooked food and less restaurant, buffet, and fast food.  When eating at a restaurant, ask that your food be prepared with less salt or no salt, if possible. What foods are recommended? The items listed may not be a complete list. Talk with your dietitian about   what dietary choices are best for you. Grains Whole-grain or whole-wheat bread. Whole-grain or whole-wheat pasta. Brown rice. Oatmeal. Quinoa. Bulgur. Whole-grain and low-sodium cereals. Pita bread. Low-fat, low-sodium crackers. Whole-wheat flour tortillas. Vegetables Fresh or frozen vegetables (raw, steamed, roasted, or grilled). Low-sodium or reduced-sodium tomato and vegetable juice. Low-sodium or reduced-sodium tomato sauce and tomato paste. Low-sodium or reduced-sodium canned vegetables. Fruits All fresh, dried, or frozen fruit. Canned fruit in natural juice (without  added sugar). Meat and other protein foods Skinless chicken or turkey. Ground chicken or turkey. Pork with fat trimmed off. Fish and seafood. Egg whites. Dried beans, peas, or lentils. Unsalted nuts, nut butters, and seeds. Unsalted canned beans. Lean cuts of beef with fat trimmed off. Low-sodium, lean deli meat. Dairy Low-fat (1%) or fat-free (skim) milk. Fat-free, low-fat, or reduced-fat cheeses. Nonfat, low-sodium ricotta or cottage cheese. Low-fat or nonfat yogurt. Low-fat, low-sodium cheese. Fats and oils Soft margarine without trans fats. Vegetable oil. Low-fat, reduced-fat, or light mayonnaise and salad dressings (reduced-sodium). Canola, safflower, olive, soybean, and sunflower oils. Avocado. Seasoning and other foods Herbs. Spices. Seasoning mixes without salt. Unsalted popcorn and pretzels. Fat-free sweets. What foods are not recommended? The items listed may not be a complete list. Talk with your dietitian about what dietary choices are best for you. Grains Baked goods made with fat, such as croissants, muffins, or some breads. Dry pasta or rice meal packs. Vegetables Creamed or fried vegetables. Vegetables in a cheese sauce. Regular canned vegetables (not low-sodium or reduced-sodium). Regular canned tomato sauce and paste (not low-sodium or reduced-sodium). Regular tomato and vegetable juice (not low-sodium or reduced-sodium). Pickles. Olives. Fruits Canned fruit in a light or heavy syrup. Fried fruit. Fruit in cream or butter sauce. Meat and other protein foods Fatty cuts of meat. Ribs. Fried meat. Bacon. Sausage. Bologna and other processed lunch meats. Salami. Fatback. Hotdogs. Bratwurst. Salted nuts and seeds. Canned beans with added salt. Canned or smoked fish. Whole eggs or egg yolks. Chicken or turkey with skin. Dairy Whole or 2% milk, cream, and half-and-half. Whole or full-fat cream cheese. Whole-fat or sweetened yogurt. Full-fat cheese. Nondairy creamers. Whipped toppings.  Processed cheese and cheese spreads. Fats and oils Butter. Stick margarine. Lard. Shortening. Ghee. Bacon fat. Tropical oils, such as coconut, palm kernel, or palm oil. Seasoning and other foods Salted popcorn and pretzels. Onion salt, garlic salt, seasoned salt, table salt, and sea salt. Worcestershire sauce. Tartar sauce. Barbecue sauce. Teriyaki sauce. Soy sauce, including reduced-sodium. Steak sauce. Canned and packaged gravies. Fish sauce. Oyster sauce. Cocktail sauce. Horseradish that you find on the shelf. Ketchup. Mustard. Meat flavorings and tenderizers. Bouillon cubes. Hot sauce and Tabasco sauce. Premade or packaged marinades. Premade or packaged taco seasonings. Relishes. Regular salad dressings. Where to find more information:  National Heart, Lung, and Blood Institute: www.nhlbi.nih.gov  American Heart Association: www.heart.org Summary  The DASH eating plan is a healthy eating plan that has been shown to reduce high blood pressure (hypertension). It may also reduce your risk for type 2 diabetes, heart disease, and stroke.  With the DASH eating plan, you should limit salt (sodium) intake to 2,300 mg a day. If you have hypertension, you may need to reduce your sodium intake to 1,500 mg a day.  When on the DASH eating plan, aim to eat more fresh fruits and vegetables, whole grains, lean proteins, low-fat dairy, and heart-healthy fats.  Work with your health care provider or diet and nutrition specialist (dietitian) to adjust your eating plan to your   individual calorie needs. This information is not intended to replace advice given to you by your health care provider. Make sure you discuss any questions you have with your health care provider. Document Released: 09/23/2011 Document Revised: 09/27/2016 Document Reviewed: 09/27/2016 Elsevier Interactive Patient Education  2019 [employer]. Diabetes Mellitus and Nutrition, Adult When you have diabetes (diabetes mellitus), it is very  important to have healthy eating habits because your blood sugar (glucose) levels are greatly affected by what you eat and drink. Eating healthy foods in the appropriate amounts, at about the same times every day, can help you:  Control your blood glucose.  Lower your risk of heart disease.  Improve your blood pressure.  Reach or maintain a healthy weight. Every person with diabetes is different, and each person has different needs for a meal plan. Your health care provider may recommend that you work with a diet and nutrition specialist (dietitian) to make a meal plan that is best for you. Your meal plan may vary depending on factors such as:  The calories you need.  The medicines you take.  Your weight.  Your blood glucose, blood pressure, and cholesterol levels.  Your activity level.  Other health conditions you have, such as heart or kidney disease. How do carbohydrates affect me? Carbohydrates, also called carbs, affect your blood glucose level more than any other type of food. Eating carbs naturally raises the amount of glucose in your blood. Carb counting is a method for keeping track of how many carbs you eat. Counting carbs is important to keep your blood glucose at a healthy level, especially if you use insulin or take certain oral diabetes medicines. It is important to know how many carbs you can safely have in each meal. This is different for every person. Your dietitian can help you calculate how many carbs you should have at each meal and for each snack. Foods that contain carbs include:  Bread, cereal, rice, pasta, and crackers.  Potatoes and corn.  Peas, beans, and lentils.  Milk and yogurt.  Fruit and juice.  Desserts, such as cakes, cookies, ice cream, and candy. How does alcohol affect me? Alcohol can cause a sudden decrease in blood glucose (hypoglycemia), especially if you use insulin or take certain oral diabetes medicines. Hypoglycemia can be a  life-threatening condition. Symptoms of hypoglycemia (sleepiness, dizziness, and confusion) are similar to symptoms of having too much alcohol. If your health care provider says that alcohol is safe for you, follow these guidelines:  Limit alcohol intake to no more than 1 drink per day for nonpregnant women and 2 drinks per day for men. One drink equals 12 oz of beer, 5 oz of wine, or 1 oz of hard liquor.  Do not drink on an empty stomach.  Keep yourself hydrated with water, diet soda, or unsweetened iced tea.  Keep in mind that regular soda, juice, and other mixers may contain a lot of sugar and must be counted as carbs. What are tips for following this plan?  Reading food labels  Start by checking the serving size on the "Nutrition Facts" label of packaged foods and drinks. The amount of calories, carbs, fats, and other nutrients listed on the label is based on one serving of the item. Many items contain more than one serving per package.  Check the total grams (g) of carbs in one serving. You can calculate the number of servings of carbs in one serving by dividing the total carbs by 15. For  example, if a food has 30 g of total carbs, it would be equal to 2 servings of carbs.  Check the number of grams (g) of saturated and trans fats in one serving. Choose foods that have low or no amount of these fats.  Check the number of milligrams (mg) of salt (sodium) in one serving. Most people should limit total sodium intake to less than 2,300 mg per day.  Always check the nutrition information of foods labeled as "low-fat" or "nonfat". These foods may be higher in added sugar or refined carbs and should be avoided.  Talk to your dietitian to identify your daily goals for nutrients listed on the label. Shopping  Avoid buying canned, premade, or processed foods. These foods tend to be high in fat, sodium, and added sugar.  Shop around the outside edge of the grocery store. This includes fresh  fruits and vegetables, bulk grains, fresh meats, and fresh dairy. Cooking  Use low-heat cooking methods, such as baking, instead of high-heat cooking methods like deep frying.  Cook using healthy oils, such as olive, canola, or sunflower oil.  Avoid cooking with butter, cream, or high-fat meats. Meal planning  Eat meals and snacks regularly, preferably at the same times every day. Avoid going long periods of time without eating.  Eat foods high in fiber, such as fresh fruits, vegetables, beans, and whole grains. Talk to your dietitian about how many servings of carbs you can eat at each meal.  Eat 4-6 ounces (oz) of lean protein each day, such as lean meat, chicken, fish, eggs, or tofu. One oz of lean protein is equal to: ? 1 oz of meat, chicken, or fish. ? 1 egg. ?  cup of tofu.  Eat some foods each day that contain healthy fats, such as avocado, nuts, seeds, and fish. Lifestyle  Check your blood glucose regularly.  Exercise regularly as told by your health care provider. This may include: ? 150 minutes of moderate-intensity or vigorous-intensity exercise each week. This could be brisk walking, biking, or water aerobics. ? Stretching and doing strength exercises, such as yoga or weightlifting, at least 2 times a week.  Take medicines as told by your health care provider.  Do not use any products that contain nicotine or tobacco, such as cigarettes and e-cigarettes. If you need help quitting, ask your health care provider.  Work with a Social worker or diabetes educator to identify strategies to manage stress and any emotional and social challenges. Questions to ask a health care provider  Do I need to meet with a diabetes educator?  Do I need to meet with a dietitian?  What number can I call if I have questions?  When are the best times to check my blood glucose? Where to find more information:  American Diabetes Association: diabetes.org  Academy of Nutrition and  Dietetics: www.eatright.[employer] of Diabetes and Digestive and Kidney Diseases (NIH): [employer] Summary  A healthy meal plan will help you control your blood glucose and maintain a healthy lifestyle.  Working with a diet and nutrition specialist (dietitian) can help you make a meal plan that is best for you.  Keep in mind that carbohydrates (carbs) and alcohol have immediate effects on your blood glucose levels. It is important to count carbs and to use alcohol carefully. This information is not intended to replace advice given to you by your health care provider. Make sure you discuss any questions you have with your health care provider. Document Released:  07/01/2005 Document Revised: 05/04/2017 Document Reviewed: 11/08/2016 Elsevier Interactive Patient Education  [employer].

## 2018-12-05 ENCOUNTER — Encounter: Payer: Self-pay | Admitting: Family Medicine

## 2018-12-13 ENCOUNTER — Ambulatory Visit (HOSPITAL_COMMUNITY)
Admission: RE | Admit: 2018-12-13 | Discharge: 2018-12-13 | Disposition: A | Discharge status: Home / Self Care | Payer: Medicare Other | Source: Ambulatory Visit | Attending: Family Medicine | Admitting: Family Medicine

## 2018-12-13 DIAGNOSIS — Z780 Asymptomatic menopausal state: Secondary | ICD-10-CM | POA: Insufficient documentation

## 2018-12-14 ENCOUNTER — Encounter: Payer: Self-pay | Admitting: Family Medicine

## 2018-12-28 ENCOUNTER — Ambulatory Visit: Payer: BC Managed Care – PPO | Admitting: Nutrition

## 2019-01-04 ENCOUNTER — Encounter: Payer: Self-pay | Admitting: Nutrition

## 2019-01-04 ENCOUNTER — Encounter: Payer: Medicare Other | Attending: Family Medicine | Admitting: Nutrition

## 2019-01-04 DIAGNOSIS — E118 Type 2 diabetes mellitus with unspecified complications: Secondary | ICD-10-CM

## 2019-01-04 DIAGNOSIS — E782 Mixed hyperlipidemia: Secondary | ICD-10-CM | POA: Insufficient documentation

## 2019-01-04 DIAGNOSIS — E669 Obesity, unspecified: Secondary | ICD-10-CM | POA: Insufficient documentation

## 2019-01-04 DIAGNOSIS — E1165 Type 2 diabetes mellitus with hyperglycemia: Secondary | ICD-10-CM

## 2019-01-04 DIAGNOSIS — IMO0002 Reserved for concepts with insufficient information to code with codable children: Secondary | ICD-10-CM

## 2019-01-04 NOTE — Progress Notes (Signed)
Telephone Visit for New Onset Diabetes Type 2.  Lives with her husband. PMH: HTN, Hyperlipidemia and Obesity. Admits to recently eating a lot of candy and sweets and thinks that is what caused her A1C to be 6.7%. She admits to eating later at night also after 7 pm.  She wants to change her diet and lifestyle instead of going on medications. She is not testing blood sugars. She was going to the [facility] walking 3-4 miles most days of week unitl the [place] virus problem. PCP Dr. Sallee Lange. She is motivated to improve eating habits and exercise to lose weight to improve her Dm. Current diet has been high in simple sugars and high fat processed foods. She does't eat beef, chicken or [place]. ONly eats eggs, pb and beans for protein.   Lab Results  Component Value Date   HGBA1C 6.7 (H) 11/14/2018   CMP Latest Ref Rng & Units 11/14/2018 11/24/2017 02/11/2014  Glucose 65 - 99 mg/dL 121(H) 121(H) 137(H)  BUN 8 - 27 mg/dL 15 10 9   Creatinine 0.57 - 1.00 mg/dL 0.97 0.96 0.93  Sodium 134 - 144 mmol/L 144 143 141  Potassium 3.5 - 5.2 mmol/L 4.5 4.1 4.0  Chloride 96 - 106 mmol/L 100 100 101  CO2 20 - 29 mmol/L 27 28 31   Calcium 8.7 - 10.3 mg/dL 10.0 9.7 9.7  Total Protein 6.0 - 8.5 g/dL 7.1 - 6.9  Total Bilirubin 0.0 - 1.2 mg/dL 0.4 - 0.5  Alkaline Phos 39 - 117 IU/L 102 - 92  AST 0 - 40 IU/L 14 - 17  ALT 0 - 32 IU/L 16 - 17   Lipid Panel     Component Value Date/Time   CHOL 181 11/14/2018 1105   TRIG 218 (H) 11/14/2018 1105   HDL 42 11/14/2018 1105   CHOLHDL 4.3 11/14/2018 1105   LDLCALC 95 11/14/2018 1105   Most recent  Ht 5'4" wt 212 lbs. BMI 36 TEE 1200 kcal 125 g CHO Pro 90 g Fat 33 g  214 hr recall: B Honey nut cheerios, 2% milk L skips D Salmon and salad, water,  Lack of food and nutrition knowledge due to diabetes as evidenced by A1C of 6.7%.   Intervention: Nutrition and Diabetes education provided on My Plate, CHO counting, meal planning, portion sizes, timing of  meals, avoiding snacks between meals target ranges for A1C and blood sugars, signs/symptoms and treatment of hyper/hypoglycemia, monitoring blood sugars, taking medications as prescribed, benefits of exercising 60 minutes per day and prevention of complications of DM.  Goals: Goals 1.Follow  My Plate 2. Do not skip meals 3. Don't eat past 7 pm 4. Increase fresh fruits and vegetables and other high fiber foods 5. Walk 60 minutes 4-5 times per week Cut out snacks between meals and after supper Drink 4-5 bottles  of water per day Cut out candy/sweets.

## 2019-01-04 NOTE — Patient Instructions (Signed)
Goals 1.Follow  My Plate 2. Do not skip meals 3. Don't eat past 7 pm 4. Increase fresh fruits and vegetables and other high fiber foods 5. Walk 60 minutes 4-5 times per week Cut out snacks between meals and after supper Drink 4-5 bottles  of water per day Cut out candy/sweets.

## 2019-01-16 ENCOUNTER — Other Ambulatory Visit: Payer: Self-pay

## 2019-01-19 ENCOUNTER — Ambulatory Visit: Payer: BC Managed Care – PPO | Admitting: Gastroenterology

## 2019-04-23 ENCOUNTER — Ambulatory Visit: Payer: Medicare Other | Admitting: Family Medicine

## 2019-05-09 ENCOUNTER — Encounter: Payer: Medicare Other | Attending: Family Medicine | Admitting: Nutrition

## 2019-05-09 ENCOUNTER — Encounter: Payer: Self-pay | Admitting: Nutrition

## 2019-05-09 ENCOUNTER — Other Ambulatory Visit: Payer: Self-pay

## 2019-05-09 DIAGNOSIS — IMO0002 Reserved for concepts with insufficient information to code with codable children: Secondary | ICD-10-CM

## 2019-05-09 DIAGNOSIS — E1165 Type 2 diabetes mellitus with hyperglycemia: Secondary | ICD-10-CM | POA: Insufficient documentation

## 2019-05-09 DIAGNOSIS — I10 Essential (primary) hypertension: Secondary | ICD-10-CM | POA: Insufficient documentation

## 2019-05-09 DIAGNOSIS — E669 Obesity, unspecified: Secondary | ICD-10-CM | POA: Insufficient documentation

## 2019-05-09 DIAGNOSIS — E118 Type 2 diabetes mellitus with unspecified complications: Secondary | ICD-10-CM | POA: Insufficient documentation

## 2019-05-09 NOTE — Progress Notes (Signed)
Telephone Visit for New Onset Diabetes Type 2.   05-09-19  Lives with her husband. PMH: HTN, Hyperlipidemia and Obesity. Changes made: eating boiled eggs, some exercise in the house. Cut out most sweets.Eating more fresh fruits and vegetables. Started taking metamucil last week. Gained 3 lbs. Willing to work harder with tracking foods and exercise with http://thomas.info/. She needs a meter to start testing her BS and would benefit from taking Metformin 500 mg BID.  Lab Results  Component Value Date   HGBA1C 6.7 (H) 11/14/2018   CMP Latest Ref Rng & Units 11/14/2018 11/24/2017 02/11/2014  Glucose 65 - 99 mg/dL 121(H) 121(H) 137(H)  BUN 8 - 27 mg/dL 15 10 9   Creatinine 0.57 - 1.00 mg/dL 0.97 0.96 0.93  Sodium 134 - 144 mmol/L 144 143 141  Potassium 3.5 - 5.2 mmol/L 4.5 4.1 4.0  Chloride 96 - 106 mmol/L 100 100 101  CO2 20 - 29 mmol/L 27 28 31   Calcium 8.7 - 10.3 mg/dL 10.0 9.7 9.7  Total Protein 6.0 - 8.5 g/dL 7.1 - 6.9  Total Bilirubin 0.0 - 1.2 mg/dL 0.4 - 0.5  Alkaline Phos 39 - 117 IU/L 102 - 92  AST 0 - 40 IU/L 14 - 17  ALT 0 - 32 IU/L 16 - 17   Lipid Panel     Component Value Date/Time   CHOL 181 11/14/2018 1105   TRIG 218 (H) 11/14/2018 1105   HDL 42 11/14/2018 1105   CHOLHDL 4.3 11/14/2018 1105   LDLCALC 95 11/14/2018 1105   Most recent  Ht 5'4" wt 212 lbs. BMI 36 TEE 1200 kcal 125 g CHO Pro 90 g Fat 33 g  214 hr recall: B Honey nut cheerios, 2% milk L skips D Salmon and salad, water,  Lack of food and nutrition knowledge due to diabetes as evidenced by A1C of 6.7%.   Intervention: Nutrition and Diabetes education provided on My Plate, CHO counting, meal planning, portion sizes, timing of meals, avoiding snacks between meals target ranges for A1C and blood sugars, signs/symptoms and treatment of hyper/hypoglycemia, monitoring blood sugars, taking medications as prescribed, benefits of exercising 60 minutes per day and prevention of complications of DM.  Goals Walk  or exercise 30 minutes every day. Continue to avoid sweets and junk food Eat meals on time- no snacks between meals. Lose 1 per week. -weigh weekly on Wednedays.  Use https://www.matthews.info/ Steps 7000 a day.   Followup 3 months. She would benefit from testing blood sugars, and taking Metformin 500 mg BID for her DM.

## 2019-05-09 NOTE — Patient Instructions (Signed)
Goals Walk or exercise 30 minutes every day. Continue to avoid sweets and junk food Eat meals on time- no snacks between meals. Lose 1 per week. -weigh weekly on Wednedays.  Use https://www.matthews.info/ Steps 7000 a day

## 2019-05-22 ENCOUNTER — Telehealth: Payer: Self-pay | Admitting: Family Medicine

## 2019-05-22 MED ORDER — MOMETASONE FUROATE 0.1 % EX CREA: TOPICAL_CREAM | CUTANEOUS | 1 refills | Status: AC

## 2019-05-22 NOTE — Telephone Encounter (Signed)
Patient having a rash that appeared on her distal forearm it is not painful is multiple small bumps consistent with the possibility of a contact dermatitis.  I would recommend Elocon cream twice daily as needed to follow-up if progressive troubles or if worse-I doubt shingles patient will let us know if not improving with the cream

## 2019-06-01 ENCOUNTER — Ambulatory Visit: Payer: Self-pay | Admitting: Gastroenterology

## 2019-06-05 ENCOUNTER — Ambulatory Visit: Payer: Medicare Other | Admitting: Family Medicine

## 2019-06-13 ENCOUNTER — Other Ambulatory Visit: Payer: Self-pay | Admitting: Family Medicine

## 2019-06-25 ENCOUNTER — Other Ambulatory Visit: Payer: Self-pay | Admitting: Family Medicine

## 2019-06-26 NOTE — Telephone Encounter (Signed)
Needs virtual visit 1 refill

## 2019-06-27 NOTE — Telephone Encounter (Signed)
Please schedule and then route back to nurses 

## 2019-06-29 NOTE — Telephone Encounter (Signed)
Patient made appointment for 9/25

## 2019-07-13 ENCOUNTER — Ambulatory Visit (INDEPENDENT_AMBULATORY_CARE_PROVIDER_SITE_OTHER): Payer: Medicare Other | Admitting: Family Medicine

## 2019-07-13 ENCOUNTER — Other Ambulatory Visit: Payer: Self-pay

## 2019-07-13 DIAGNOSIS — I10 Essential (primary) hypertension: Secondary | ICD-10-CM

## 2019-07-13 DIAGNOSIS — T783XXA Angioneurotic edema, initial encounter: Secondary | ICD-10-CM | POA: Diagnosis not present

## 2019-07-13 NOTE — Progress Notes (Signed)
   Subjective:    Patient ID: Beth Mayo, female    DOB: [birth year], 68 y.o.   MRN: [id number]  Hypertension This is a chronic problem. The current episode started more than 1 year ago. Pertinent negatives include no chest pain or shortness of breath. Risk factors for coronary artery disease include post-menopausal state. Treatments tried: dyazide. There are no compliance problems.   She relates compliance with the medicine states blood pressure is been under good control  Patient states she has been having an allergic reaction to something for a week- hives come and go in different places- started after eating shredded wheat She had a severe swelling about 5 years ago on the left side of her face that they thought was due to ACE inhibitor she stopped ACE inhibitor's but she has had multiple different spells in various areas that are angioedema she is not taking anything for other than Benadryl Virtual Visit via Video Note  I connected with Lajean Manes on 07/13/19 at  9:00 AM EDT by a video enabled telemedicine application and verified that I am speaking with the correct person using two identifiers.  Location: Patient: home Provider: office    I discussed the limitations of evaluation and management by telemedicine and the availability of in person appointments. The patient expressed understanding and agreed to proceed.  History of Present Illness:    Observations/Objective:   Assessment and Plan:   Follow Up Instructions:    I discussed the assessment and treatment plan with the patient. The patient was provided an opportunity to ask questions and all were answered. The patient agreed with the plan and demonstrated an understanding of the instructions.   The patient was advised to call back or seek an in-person evaluation if the symptoms worsen or if the condition fails to improve as anticipated.  I provided 20 minutes of non-face-to-face time during this encounter.       Review of Systems  Constitutional: Negative for activity change, appetite change and fatigue.  HENT: Negative for congestion and rhinorrhea.   Respiratory: Negative for cough and shortness of breath.   Cardiovascular: Negative for chest pain and leg swelling.  Gastrointestinal: Negative for abdominal pain and diarrhea.  Endocrine: Negative for polydipsia and polyphagia.  Skin: Positive for rash. Negative for color change.  Neurological: Negative for dizziness and weakness.  Psychiatric/Behavioral: Negative for behavioral problems and confusion.       Objective:   Physical Exam Patient had virtual visit Appears to be in no distress Atraumatic Neuro able to relate and oriented No apparent resp distress Color normal        Assessment & Plan:  Blood pressure decent control has not been able to check it recently I offered that we would check it here at the office or come by in the near future  Angioedema she showed me several pictures through her cell phone which does indicate angioedema more than likely idiopathic but it is reasonable to see allergist at this point

## 2019-07-17 ENCOUNTER — Other Ambulatory Visit: Payer: Self-pay | Admitting: *Deleted

## 2019-07-17 DIAGNOSIS — Z20822 Contact with and (suspected) exposure to covid-19: Secondary | ICD-10-CM

## 2019-07-18 ENCOUNTER — Telehealth: Payer: Self-pay | Admitting: Family Medicine

## 2019-07-18 ENCOUNTER — Ambulatory Visit (INDEPENDENT_AMBULATORY_CARE_PROVIDER_SITE_OTHER): Payer: Medicare Other | Admitting: Family Medicine

## 2019-07-18 DIAGNOSIS — U071 COVID-19: Secondary | ICD-10-CM

## 2019-07-18 LAB — NOVEL CORONAVIRUS, NAA: SARS-CoV-2, NAA: DETECTED — AB

## 2019-07-18 NOTE — Telephone Encounter (Signed)
I recommend that we can do a virtual visit today

## 2019-07-18 NOTE — Telephone Encounter (Signed)
Virtual visit scheduled today with Dr Scott 

## 2019-07-18 NOTE — Progress Notes (Signed)
  Subjective:     Patient ID: Beth Mayo, female   DOB: 1951-02-19, 68 y.o.   MRN: [id number]  HPI Virtual Visit via Video Note  I connected with Lajean Manes on 07/18/19 at  4:10 PM EDT by a video enabled telemedicine application and verified that I am speaking with the correct person using two identifiers.  Chief complaint-positive COVID test Patient states she has been asymptomatic but the reason she got tested was because her husband start showing symptoms she related that she is not had any sore throat runny nose cough wheezing difficulty breathing vomiting diarrhea or body aches.  She does have underlying risk factors for COVID we talked at length about what a positive test means she lives at home with her husband she also has a granddaughter who stays there currently the granddaughter will be self quarantine for 2 weeks with them Location: Patient: Home Provider: Office   I discussed the limitations of evaluation and management by telemedicine and the availability of in person appointments. The patient expressed understanding and agreed to proceed.  History of Present Illness:    Observations/Objective:   Assessment and Plan:   Follow Up Instructions:    I discussed the assessment and treatment plan with the patient. The patient was provided an opportunity to ask questions and all were answered. The patient agreed with the plan and demonstrated an understanding of the instructions.   The patient was advised to call back or seek an in-person evaluation if the symptoms worsen or if the condition fails to improve as anticipated.  I provided 15 minutes of non-face-to-face time during this encounter.   Sallee Lange, MD    Review of Systems     Objective:   Physical Exam Today's visit was via telephone Physical exam was not possible for this visit     Assessment:     Positive COVID infection    Plan:     Warning signs were discussed Home quarantine  discussed Stay at home at least 10 days Avoid contact with others Warning signs regarding dizziness passing out shortness of breath were discussed in detail

## 2019-07-18 NOTE — Telephone Encounter (Signed)
Patient got her test back and was positive. She had her daughter tested also because she lives with her what should she do now. Patient has some questions.Her husband test has not came back yet. Please advise

## 2019-07-24 ENCOUNTER — Encounter: Payer: Self-pay | Admitting: Family Medicine

## 2019-08-09 ENCOUNTER — Ambulatory Visit: Payer: Medicare Other | Admitting: Nutrition

## 2019-08-14 ENCOUNTER — Other Ambulatory Visit: Payer: Self-pay

## 2019-08-14 ENCOUNTER — Encounter: Payer: Self-pay | Admitting: Nutrition

## 2019-08-14 ENCOUNTER — Encounter: Payer: Medicare Other | Attending: Family Medicine | Admitting: Nutrition

## 2019-08-14 VITALS — Ht 64.0 in | Wt 198.0 lb

## 2019-08-14 DIAGNOSIS — I10 Essential (primary) hypertension: Secondary | ICD-10-CM | POA: Insufficient documentation

## 2019-08-14 DIAGNOSIS — E669 Obesity, unspecified: Secondary | ICD-10-CM

## 2019-08-14 DIAGNOSIS — E118 Type 2 diabetes mellitus with unspecified complications: Secondary | ICD-10-CM

## 2019-08-14 DIAGNOSIS — IMO0002 Reserved for concepts with insufficient information to code with codable children: Secondary | ICD-10-CM

## 2019-08-14 DIAGNOSIS — E1165 Type 2 diabetes mellitus with hyperglycemia: Secondary | ICD-10-CM

## 2019-08-14 NOTE — Progress Notes (Signed)
Telephone Visit Follow up for New Onset Diabetes Type 2.     Lives with her husband. PMH: HTN, Hyperlipidemia and Obesity. Changes made: eating boiled eggs, some exercise in the house. Lost 17 lbs/ Changes made: Hasn't been eating much. Her husband is in the hospital with [facility]. She notes her weight loss is mostly related to stress and not eating as much. Tries to get out and walk some. Not testing blood sugars. One Touch Verio meter given and instructed how to use it.  Lab Results  Component Value Date   HGBA1C 6.7 (H) 11/14/2018   CMP Latest Ref Rng & Units 11/14/2018 11/24/2017 02/11/2014  Glucose 65 - 99 mg/dL 121(H) 121(H) 137(H)  BUN 8 - 27 mg/dL 15 10 9   Creatinine 0.57 - 1.00 mg/dL 0.97 0.96 0.93  Sodium 134 - 144 mmol/L 144 143 141  Potassium 3.5 - 5.2 mmol/L 4.5 4.1 4.0  Chloride 96 - 106 mmol/L 100 100 101  CO2 20 - 29 mmol/L 27 28 31   Calcium 8.7 - 10.3 mg/dL 10.0 9.7 9.7  Total Protein 6.0 - 8.5 g/dL 7.1 - 6.9  Total Bilirubin 0.0 - 1.2 mg/dL 0.4 - 0.5  Alkaline Phos 39 - 117 IU/L 102 - 92  AST 0 - 40 IU/L 14 - 17  ALT 0 - 32 IU/L 16 - 17   Lipid Panel     Component Value Date/Time   CHOL 181 11/14/2018 1105   TRIG 218 (H) 11/14/2018 1105   HDL 42 11/14/2018 1105   CHOLHDL 4.3 11/14/2018 1105   LDLCALC 95 11/14/2018 1105   Most recent  Ht 5'4" wt 212 lbs. BMI 36 TEE 1200 kcal 125 g CHO Pro 90 g Fat 33 g  214 hr recall: B Honey nut cheerios, 2% milk L skips D Salmon and salad, water,  Lack of food and nutrition knowledge due to diabetes as evidenced by A1C of 6.7%.   Intervention: Nutrition and Diabetes education provided on My Plate, CHO counting, meal planning, portion sizes, timing of meals, avoiding snacks between meals target ranges for A1C and blood sugars, signs/symptoms and treatment of hyper/hypoglycemia, monitoring blood sugars, taking medications as prescribed, benefits of exercising 60 minutes per day and prevention of complications of  DM.  Goals Walk or exercise 30 minutes every day. Continue to avoid sweets and junk food Eat meals on time- no snacks between meals. Lose 1 per week. -weigh weekly on Wednedays.  Use https://www.matthews.info/ Steps 7000 a day.   Followup 3 months. She would benefit from testing blood sugars, and taking Metformin 500 mg BID for her DM.

## 2019-08-14 NOTE — Patient Instructions (Signed)
F/u 3 months  Eat three meals per day Increase fresh fruits and vegetables Drink only water  Exercise 60 minutes 3-4 times per week Lose 3-4 lbs per month Get A1C 6% or less.

## 2019-08-24 ENCOUNTER — Ambulatory Visit (INDEPENDENT_AMBULATORY_CARE_PROVIDER_SITE_OTHER): Payer: Medicare Other | Admitting: Allergy & Immunology

## 2019-08-24 ENCOUNTER — Encounter: Payer: Self-pay | Admitting: Allergy & Immunology

## 2019-08-24 ENCOUNTER — Other Ambulatory Visit: Payer: Self-pay

## 2019-08-24 VITALS — BP 168/92 | HR 88 | Temp 97.2°F | Resp 20 | Ht 63.0 in | Wt 197.2 lb

## 2019-08-24 DIAGNOSIS — L508 Other urticaria: Secondary | ICD-10-CM

## 2019-08-24 DIAGNOSIS — J301 Allergic rhinitis due to pollen: Secondary | ICD-10-CM | POA: Diagnosis not present

## 2019-08-24 DIAGNOSIS — L500 Allergic urticaria: Secondary | ICD-10-CM | POA: Diagnosis not present

## 2019-08-24 DIAGNOSIS — T783XXD Angioneurotic edema, subsequent encounter: Secondary | ICD-10-CM | POA: Diagnosis not present

## 2019-08-24 NOTE — Progress Notes (Signed)
NEW PATIENT  Date of Service/Encounter:  08/24/19  Referring provider: Kathyrn Drown, MD   Assessment:   Angioedema with chronic urticaria   Seasonal allergic rhinitis due to pollen (grasses, molds)  Recent COVID-19 diagnosis   Beth Mayo is a delightful 68 year old female presenting for evaluation of chronic urticaria.  She has no concerning symptoms with regards to urticaria, including fevers, joint pain, or permanent skin changes.  Actually, her hives have been able to be controlled with a cetirizine 10 mg daily.  She did stop it 3 days ago for this appointment and has had no breakouts since then.  Testing today is only positive for all of the grasses as well as 1 mold.  Her episode started when she was outside in her patio, so the grass could have been a contributing factor to her hives.  We are getting get some labs to ensure that she is having no serious causes of hives and swelling.  I did ask her to stop the cetirizine now to see how she does without it.  She is going to call us with any kind of updates.  Once we get the labs back, we can certainly make different management decisions based on the results of those labs.  Plan/Recommendations:   1. Angioedema with chronic urticaria - Testing was positive to grasses and one mold. - Avoidance measures provided. - Your history does not have any "red flags" such as fevers, joint pains, or permanent skin changes that would be concerning for a more serious cause of hives.  - We will get some labs to rule out serious causes of hives: alpha gal, complete blood count, tryptase level, chronic urticaria panel, CMP, ESR, and CRP. - We are ordering labs, so please allow 1-2 weeks for the results to come back. - With the newly implemented Cures Act, the labs might be visible to you at the same time that they become visible to me. - However, I will not address the results until all of the results come  back, so please be patient.  - In the  meantime, continue avoiding your triggering food(s) in your After Visit Summary, including avoidance measures (if applicable), until you hear from me about the results.   - Chronic hives are often times a self limited process and will "burn themselves out" over 6-12 months, although this is not always the case.  - Try stopping your cetirizine to see if the hives and swelling return.  2. Return in about 3 months (around 11/24/2019). This can be an in-person, a virtual Webex or a telephone follow up visit.  Subjective:   Beth Mayo is a 68 y.o. female presenting today for evaluation of  Chief Complaint  Patient presents with  . Urticaria    x3 months in random spots. None since starting Zyrtec.  . Angioedema    Swelling of tongue in combination with hives    Beth Mayo has a history of the following: Patient Active Problem List   Diagnosis Date Noted  . Leg pain 09/27/2016  . HTN (hypertension) 01/27/2015  . Dyspepsia 11/30/2012    History obtained from: chart review and patient.  Beth Mayo was referred by Kathyrn Drown, MD.     Beth Mayo is a 68 y.o. female presenting for an evaluation of chronic urticaria. Symptoms started around the last week of September. She was breaking out on her chest, tongue, etc. She was started on cetirizine and everything has been well controlled. She  has taken one per day. She was not eating at the time at all and was just sitting on her back porch. She was not eating anything at all. She dis stop her antihistamines and has not had a breakout since Monday.   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 She had used a "tremendous amount" of hand sanitizer and developed urticaria and swelling. This affected her entire face and periorbital area. She was given a "bunch of stuff" to treat it. It started going down in two weeks and she was looking like her normal self.   She does have occasional itchy watery eyes and runny nose. She  eats all of the major food allergens without adverse event. She does not eat red meat at all.   She and her husband both caught COVID-19 at the beginning of October.  She did have some mild symptoms but was able to manage them at home.  Unfortunately, her husband was intubated for almost a month before being converted to a tracheostomy.  He now has kidney failure from this and is still hospitalized.  She was told today that he would likely need to go to a long-term care facility to regain much of his strength.  She has not taken this with news well.  Otherwise, there is no history of other atopic diseases, including asthma, food allergies, drug allergies, environmental allergies, stinging insect allergies or contact dermatitis. There is no significant infectious history. Vaccinations are up to date.    Past Medical History: Patient Active Problem List   Diagnosis Date Noted  . Leg pain 09/27/2016  . HTN (hypertension) 01/27/2015  . Dyspepsia 11/30/2012    Medication List:  Allergies as of 08/24/2019      Reactions   Ace Inhibitors Swelling   Codeine Other (See Comments)   Make her go crazy   Phenobarbital Other (See Comments)   Makes her crazy      Medication List       Accurate as of August 24, 2019  2:54 PM. If you have any questions, ask your nurse or doctor.        STOP taking these medications   albuterol 108 (90 Base) MCG/ACT inhaler Commonly known as: VENTOLIN HFA Stopped by: Valentina Shaggy, MD     TAKE these medications   Biotin 10000 MCG Tabs Take by mouth daily.   cetirizine 10 MG tablet Commonly known as: ZYRTEC Take 10 mg by mouth daily.   KETOCONAZOLE-CLEANSER EX Apply topically daily.   mometasone 0.1 % cream Commonly known as: Elocon Apply thin amount twice daily for 5 to 10 days to the affected area   pantoprazole 40 MG tablet Commonly known as: PROTONIX TAKE 1 TABLET BY MOUTH EVERY DAY   triamterene-hydrochlorothiazide 37.5-25 MG capsule  Commonly known as: DYAZIDE TAKE 1 TABLET BY MOUTH DAILY.   Vitamin D3 75 MCG (3000 UT) Tabs Take by mouth daily.       Birth History: non-contributory  Developmental History: non-contributory  Past Surgical History: Past Surgical History:  Procedure Laterality Date  . BREAST SURGERY     breast reduction  . CESAREAN SECTION     X2  . COLONOSCOPY  01/18/2008     SLF: Moderate internal hemorrhoids.Otherwise no polyps, masses inflammatory changes, diverticular  AVMs.  Marland Kitchen DILATION AND CURETTAGE OF UTERUS     for miscarriage  . ESOPHAGOGASTRODUODENOSCOPY N/A 12/04/2012   GTX:MIWOEHOZ ring was found at the gastroesophageal junction/Small hiatal hernia/  MODERATE Erosive gastritis/MILD DUODENITIS in the bulb and second portion of the duodenum/NEW ONSET DYSPEPSIA DUE TO NSAID INDUCED GASTRITIS/DUODENITIS, negative path  . rotator cuff right Right   . TONSILLECTOMY    . TUBAL LIGATION       Family History: Family History  Problem Relation Age of Onset  . Colon cancer Neg Hx   . Allergic rhinitis Neg Hx   . Angioedema Neg Hx   . Asthma Neg Hx   . Atopy Neg Hx   . Eczema Neg Hx   . Immunodeficiency Neg Hx   . Urticaria Neg Hx      Social History: Abigayl lives at home with her husband.  She lives in a house that is 68 years old.  There is wood flooring throughout the home.  She has electric heating and central cooling.  There are no animals inside or outside of the home.  She does not have dust mite covers on the bedding.  There is no tobacco exposure.  She is a retired second Land.   Review of Systems  Constitutional: Negative.  Negative for chills, fever, malaise/fatigue and weight loss.  HENT: Negative.  Negative for congestion, ear discharge and ear pain.   Eyes: Negative for pain, discharge and redness.  Respiratory: Negative for cough, sputum production, shortness of breath and wheezing.   Cardiovascular: Negative.  Negative for chest pain and palpitations.   Gastrointestinal: Negative for abdominal pain, constipation, diarrhea, heartburn, nausea and vomiting.  Skin: Negative.  Negative for itching and rash.       Positive for swelling.  Neurological: Negative for dizziness and headaches.  Endo/Heme/Allergies: Positive for environmental allergies. Does not bruise/bleed easily.       Objective:   Blood pressure (!) 168/92, pulse 88, temperature (!) 97.2 F (36.2 C), temperature source Temporal, resp. rate 20, height _0  (1.6 m), weight 197 lb 3.2 oz (89.4 kg), SpO2 97 %. Body mass index is 34.93 kg/m.   Physical Exam:   Physical Exam  Constitutional: She appears well-developed.  HENT:  Head: Normocephalic and atraumatic.  Right Ear: Tympanic membrane, external ear and ear canal normal. No drainage, swelling or tenderness. Tympanic membrane is not injected, not scarred, not erythematous, not retracted and not bulging.  Left Ear: Tympanic membrane, external ear and ear canal normal. No drainage, swelling or tenderness. Tympanic membrane is not injected, not scarred, not erythematous, not retracted and not bulging.  Nose: No mucosal edema, rhinorrhea, nasal deformity or septal deviation. No epistaxis. Right sinus exhibits no maxillary sinus tenderness and no frontal sinus tenderness. Left sinus exhibits no maxillary sinus tenderness and no frontal sinus tenderness.  Mouth/Throat: Uvula is midline and oropharynx is clear and moist. Mucous membranes are not pale and not dry.  Eyes: Pupils are equal, round, and reactive to light. Conjunctivae and EOM are normal. Right eye exhibits no chemosis and no discharge. Left eye exhibits no chemosis and no discharge. Right conjunctiva is not injected. Left conjunctiva is not injected.  Cardiovascular: Normal rate, regular rhythm and normal heart sounds.  Respiratory: Effort normal and breath sounds normal. No accessory muscle usage. No tachypnea. No respiratory distress. She has no wheezes. She has no  rhonchi. She has no rales. She exhibits no tenderness.  Moving air well in all lung fields. No increased work of breathing noted.   GI: There is no abdominal tenderness. There is no rebound and no guarding.  Lymphadenopathy:       Head (right side): No submandibular,  no tonsillar and no occipital adenopathy present.       Head (left side): No submandibular, no tonsillar and no occipital adenopathy present.    She has no cervical adenopathy.  Neurological: She is alert.  Skin: No abrasion, no petechiae and no rash noted. Rash is not papular, not vesicular and not urticarial. No erythema. No pallor.  No urticarial or eczematous lesions noted.   Psychiatric: She has a normal mood and affect.     Diagnostic studies:     Allergy Studies:    Airborne Adult Perc - 08/24/19 1426    Time Antigen Placed  1426    Allergen Manufacturer  Lavella Hammock    Location  Back    Number of Test  59    Panel 1  Select    1. Control-Buffer 50% Glycerol  Negative    2. Control-Histamine 1 mg/ml  2+    3. Albumin saline  Negative    4. [place]  3+    5. [place]  3+    6. [place]  3+    7. [place] Blue  3+    8. Meadow Fescue  3+    9. Perennial Rye  3+    10. Sweet Vernal  3+    11. Timothy  3+    12. Cocklebur  Negative    13. Burweed Marshelder  Negative    14. Ragweed, short  Negative    15. Ragweed, Giant  Negative    16. Plantain,  English  Negative    17. Lamb's Quarters  Negative    18. Sheep Sorrell  Negative    19. Rough Pigweed  Negative    20. Marsh Elder, Rough  Negative    21. Mugwort, Common  Negative    22. Ash mix  Negative    23. Birch mix  Negative    24. Beech American  Negative    25. Box, Elder  Negative    26. Cedar, red  Negative    27. Cottonwood, [place]  Negative    28. Elm mix  Negative    29. Hickory mix  Negative    30. Maple mix  Negative    31. Oak, [place] mix  Negative    32. Pecan Pollen  Negative    33. Pine mix  Negative    34. Sycamore Eastern  Negative     35. Boswell, Black Pollen  Negative    36. Alternaria alternata  Negative    37. Cladosporium Herbarum  Negative    38. Aspergillus mix  Negative    39. Penicillium mix  Negative    40. Bipolaris sorokiniana (Helminthosporium)  Negative    41. Drechslera spicifera (Curvularia)  Negative    42. Mucor plumbeus  Negative    43. Fusarium moniliforme  Negative    44. Aureobasidium pullulans (pullulara)  Negative    45. Rhizopus oryzae  Negative    46. Botrytis cinera  Negative    47. Epicoccum nigrum  Negative    48. Phoma betae  Negative    49. Candida Albicans  Negative    50. Trichophyton mentagrophytes  --   +/-   51. Mite, D Farinae  5,000 AU/ml  Negative    52. Mite, D Pteronyssinus  5,000 AU/ml  Negative    53. Cat Hair 10,000 BAU/ml  Negative    54.  Dog Epithelia  Negative    55. Mixed Feathers  Negative    56. Horse Epithelia  Negative    57. Cockroach, German  Negative    58. Mouse  Negative    59. Tobacco Leaf  Negative     Food Perc - 08/24/19 1426    Time Antigen Placed  1426    Allergen Manufacturer  Lavella Hammock    Location  Back    Number of allergen test  10    Food  Select    1. Peanut  Negative    2. Soybean food  Negative    3. Wheat, whole  Negative    4. Sesame  Negative    5. Milk, cow  Negative    6. Egg White, chicken  Negative    7. Casein  Negative    8. Shellfish mix  Negative    9. Fish mix  Negative    10. Cashew  Negative       Allergy testing results were read and interpreted by myself, documented by clinical staff.         Salvatore Marvel, MD [facility] [place] [facility] [place]

## 2019-08-24 NOTE — Patient Instructions (Addendum)
1. Angioedema with chronic urticaria - Testing was positive to grasses and one mold. - Avoidance measures provided. - Your history does not have any "red flags" such as fevers, joint pains, or permanent skin changes that would be concerning for a more serious cause of hives.  - We will get some labs to rule out serious causes of hives: alpha gal, complete blood count, tryptase level, chronic urticaria panel, CMP, ESR, and CRP. - We are ordering labs, so please allow 1-2 weeks for the results to come back. - With the newly implemented Cures Act, the labs might be visible to you at the same time that they become visible to me. - However, I will not address the results until all of the results come  back, so please be patient.  - In the meantime, continue avoiding your triggering food(s) in your After Visit Summary, including avoidance measures (if applicable), until you hear from me about the results.   - Chronic hives are often times a self limited process and will "burn themselves out" over 6-12 months, although this is not always the case.  - Try stopping your cetirizine to see if the hives and swelling return.  2. Return in about 3 months (around 11/24/2019). This can be an in-person, a virtual Webex or a telephone follow up visit.   Please inform us of any Emergency Department visits, hospitalizations, or changes in symptoms. Call us before going to the ED for breathing or allergy symptoms since we might be able to fit you in for a sick visit. Feel free to contact us anytime with any questions, problems, or concerns.  It was a pleasure to meet you today!  Websites that have reliable patient information: 1. American Academy of Asthma, Allergy, and Immunology: www.aaaai.org 2. Food Allergy Research and Education (FARE): foodallergy.org 3. Mothers of Asthmatics: http://www.asthmacommunitynetwork.org 4. American College of Allergy, Asthma, and Immunology: www.acaai.org  "Like" [place] on Facebook  and Instagram for our latest updates!      Make sure you are registered to vote! If you have moved or changed any of your contact information, you will need to get this updated before voting!  In some cases, you MAY be able to register to vote online: [email or web address]    Reducing Pollen Exposure  The American Academy of Allergy, Asthma and Immunology suggests the following steps to reduce your exposure to pollen during allergy seasons.    1. Do not hang sheets or clothing out to dry; pollen may collect on these items. 2. Do not mow lawns or spend time around freshly cut grass; mowing stirs up pollen. 3. Keep windows closed at night.  Keep car windows closed while driving. 4. Minimize morning activities outdoors, a time when pollen counts are usually at their highest. 5. Stay indoors as much as possible when pollen counts or humidity is high and on windy days when pollen tends to remain in the air longer. 6. Use air conditioning when possible.  Many air conditioners have filters that trap the pollen spores. 7. Use a HEPA room air filter to remove pollen form the indoor air you breathe.  Control of Mold Allergen   Mold and fungi can grow on a variety of surfaces provided certain temperature and moisture conditions exist.  Outdoor molds grow on plants, decaying vegetation and soil.  The major outdoor mold, Alternaria and Cladosporium, are found in very high numbers during hot and dry conditions.  Generally, a late Summer - Fall peak is seen for common  outdoor fungal spores.  Rain will temporarily lower outdoor mold spore count, but counts rise rapidly when the rainy period ends.  The most important indoor molds are Aspergillus and Penicillium.  Dark, humid and poorly ventilated basements are ideal sites for mold growth.  The next most common sites of mold growth are the bathroom and the kitchen.

## 2019-08-28 ENCOUNTER — Encounter: Payer: Self-pay | Admitting: Nutrition

## 2019-09-11 LAB — CMP14+EGFR
ALT: 18 IU/L (ref 0–32)
AST: 23 IU/L (ref 0–40)
Albumin/Globulin Ratio: 1.9 (ref 1.2–2.2)
Albumin: 4.7 g/dL (ref 3.8–4.8)
Alkaline Phosphatase: 97 IU/L (ref 39–117)
BUN/Creatinine Ratio: 8 — ABNORMAL LOW (ref 12–28)
BUN: 8 mg/dL (ref 8–27)
Bilirubin Total: 0.5 mg/dL (ref 0.0–1.2)
CO2: 28 mmol/L (ref 20–29)
Calcium: 10.1 mg/dL (ref 8.7–10.3)
Chloride: 96 mmol/L (ref 96–106)
Creatinine, Ser: 0.95 mg/dL (ref 0.57–1.00)
GFR calc Af Amer: 72 mL/min/{1.73_m2} (ref >59)
GFR calc non Af Amer: 63 mL/min/{1.73_m2} (ref >59)
Globulin, Total: 2.5 g/dL (ref 1.5–4.5)
Glucose: 168 mg/dL — ABNORMAL HIGH (ref 65–99)
Potassium: 3.8 mmol/L (ref 3.5–5.2)
Sodium: 143 mmol/L (ref 134–144)
Total Protein: 7.2 g/dL (ref 6.0–8.5)

## 2019-09-11 LAB — ALLERGEN PROFILE, MOLD
Aureobasidi Pullulans IgE: <0.1 kU/L
Candida Albicans IgE: <0.1 kU/L
M009-IgE Fusarium proliferatum: <0.1 kU/L
M014-IgE Epicoccum purpur: <0.1 kU/L
Phoma Betae IgE: <0.1 kU/L
Setomelanomma Rostrat: <0.1 kU/L

## 2019-09-11 LAB — IGE+ALLERGENS ZONE 2(30)
Alternaria Alternata IgE: <0.1 kU/L
Amer Sycamore IgE Qn: <0.1 kU/L
Aspergillus Fumigatus IgE: <0.1 kU/L
Bahia Grass IgE: 3.67 kU/L — AB
Bermuda Grass IgE: 1.61 kU/L — AB
Cat Dander IgE: <0.1 kU/L
Cedar, Mountain IgE: <0.1 kU/L
Cladosporium Herbarum IgE: <0.1 kU/L
Cockroach, American IgE: <0.1 kU/L
Common Silver Birch IgE: <0.1 kU/L
D Farinae IgE: 0.11 kU/L — AB
D Pteronyssinus IgE: <0.1 kU/L
Dog Dander IgE: 0.2 kU/L — AB
Elm, American IgE: 0.12 kU/L — AB
Hickory, White IgE: 0.31 kU/L — AB
IgE (Immunoglobulin E), Serum: 104 IU/mL (ref 6–495)
Johnson Grass IgE: 2.47 kU/L — AB
Maple/Box Elder IgE: <0.1 kU/L
Mucor Racemosus IgE: <0.1 kU/L
Mugwort IgE Qn: <0.1 kU/L
Nettle IgE: <0.1 kU/L
Oak, White IgE: <0.1 kU/L
Penicillium Chrysogen IgE: <0.1 kU/L
Pigweed, Rough IgE: <0.1 kU/L
Plantain, English IgE: <0.1 kU/L
Ragweed, Short IgE: 0.14 kU/L — AB
Sheep Sorrel IgE Qn: <0.1 kU/L
Stemphylium Herbarum IgE: <0.1 kU/L
Sweet gum IgE RAST Ql: <0.1 kU/L
Timothy Grass IgE: 3.67 kU/L — AB
White Mulberry IgE: <0.1 kU/L

## 2019-09-11 LAB — ALPHA-GAL PANEL
Alpha Gal IgE*: <0.1 kU/L (ref <0.10)
Beef (Bos spp) IgE: <0.1 kU/L (ref <0.35)
Class Interpretation: 0
Class Interpretation: 0
Class Interpretation: 0
Lamb/Mutton (Ovis spp) IgE: <0.1 kU/L (ref <0.35)
Pork (Sus spp) IgE: <0.1 kU/L (ref <0.35)

## 2019-09-11 LAB — TRYPTASE: Tryptase: 8.4 ug/L (ref 2.2–13.2)

## 2019-09-11 LAB — C-REACTIVE PROTEIN: CRP: 4 mg/L (ref 0–10)

## 2019-09-11 LAB — RHEUMATOID FACTOR: Rheumatoid fact SerPl-aCnc: <10 IU/mL (ref 0.0–13.9)

## 2019-09-11 LAB — CHRONIC URTICARIA: cu index: 3.7 (ref <10)

## 2019-09-11 LAB — THYROID ANTIBODIES
Thyroglobulin Antibody: <1 IU/mL (ref 0.0–0.9)
Thyroperoxidase Ab SerPl-aCnc: <9 IU/mL (ref 0–34)

## 2019-09-11 LAB — ANA W/REFLEX IF POSITIVE: Anti Nuclear Antibody (ANA): NEGATIVE

## 2019-09-11 LAB — SEDIMENTATION RATE: Sed Rate: 21 mm/hr (ref 0–40)

## 2019-10-02 ENCOUNTER — Other Ambulatory Visit: Payer: Self-pay | Admitting: Family Medicine

## 2019-11-14 ENCOUNTER — Encounter: Payer: Self-pay | Admitting: Nutrition

## 2019-11-14 ENCOUNTER — Other Ambulatory Visit: Payer: Self-pay

## 2019-11-14 ENCOUNTER — Encounter: Payer: Medicare PPO | Attending: Family Medicine | Admitting: Nutrition

## 2019-11-14 DIAGNOSIS — Z713 Dietary counseling and surveillance: Secondary | ICD-10-CM | POA: Diagnosis not present

## 2019-11-14 DIAGNOSIS — E119 Type 2 diabetes mellitus without complications: Secondary | ICD-10-CM | POA: Insufficient documentation

## 2019-11-14 NOTE — Patient Instructions (Signed)
Goals Walk or exercise 30 minutes every day. Continue to avoid sweets and junk food Eat meals on time- no snacks between meals. Lose 10 lbs in the next 4 months; Steps 9,000-10000 a day.

## 2019-11-14 NOTE — Progress Notes (Signed)
Telephone Visit Follow up for New Onset Diabetes Type 2.     Getting 9,000-10,000 steps a day now. Her husband is home from hospital after 4 months with COVID. So, she she has been having to care for him. Started to get back and start taking care of herself again. No recent blood work. Not testing blood sugars right now. No meds for her diabetes. Controlling with diet and exercise. Working getting back to eating better balanced meals.   Lab Results  Component Value Date   HGBA1C 6.7 (H) 11/14/2018   CMP Latest Ref Rng & Units 09/04/2019 11/14/2018 11/24/2017  Glucose 65 - 99 mg/dL 168(H) 121(H) 121(H)  BUN 8 - 27 mg/dL 8 15 10   Creatinine 0.57 - 1.00 mg/dL 0.95 0.97 0.96  Sodium 134 - 144 mmol/L 143 144 143  Potassium 3.5 - 5.2 mmol/L 3.8 4.5 4.1  Chloride 96 - 106 mmol/L 96 100 100  CO2 20 - 29 mmol/L 28 27 28   Calcium 8.7 - 10.3 mg/dL 10.1 10.0 9.7  Total Protein 6.0 - 8.5 g/dL 7.2 7.1 -  Total Bilirubin 0.0 - 1.2 mg/dL 0.5 0.4 -  Alkaline Phos 39 - 117 IU/L 97 102 -  AST 0 - 40 IU/L 23 14 -  ALT 0 - 32 IU/L 18 16 -   Lipid Panel     Component Value Date/Time   CHOL 181 11/14/2018 1105   TRIG 218 (H) 11/14/2018 1105   HDL 42 11/14/2018 1105   CHOLHDL 4.3 11/14/2018 1105   LDLCALC 95 11/14/2018 1105   Most recent  Ht 5'4" wt 212 lbs. BMI 36 TEE 1200 kcal 125 g CHO Pro 90 g Fat 33 g  214 hr recall: B Honey nut cheerios, 2% milk L skips D Salmon and salad, water,  Lack of food and nutrition knowledge due to diabetes as evidenced by A1C of 6.7%.   Intervention: Nutrition and Diabetes education provided on My Plate, CHO counting, meal planning, portion sizes, timing of meals, avoiding snacks between meals target ranges for A1C and blood sugars, signs/symptoms and treatment of hyper/hypoglycemia, monitoring blood sugars, taking medications as prescribed, benefits of exercising 60 minutes per day and prevention of complications of DM.  Goals Walk or exercise 30 minutes  every day. Continue to avoid sweets and junk food Eat meals on time- no snacks between meals. Lose 10 lbs in the next 4 months; Steps 9,000-10000 a day.  Increase fresh fruits and vegetables.  Followup 3 months. She would benefit from testing blood sugars, and taking Metformin 500 mg BID for her DM.

## 2019-11-23 ENCOUNTER — Ambulatory Visit: Payer: Medicare Other | Admitting: Allergy & Immunology

## 2019-12-07 ENCOUNTER — Other Ambulatory Visit: Payer: Self-pay | Admitting: Family Medicine

## 2019-12-07 NOTE — Telephone Encounter (Signed)
Last med check up 07/10/19

## 2019-12-09 NOTE — Telephone Encounter (Signed)
May have 90-day refill needs follow-up visit this spring

## 2019-12-18 ENCOUNTER — Ambulatory Visit: Payer: Medicare PPO | Attending: Internal Medicine

## 2019-12-18 DIAGNOSIS — Z23 Encounter for immunization: Secondary | ICD-10-CM | POA: Insufficient documentation

## 2019-12-18 NOTE — Progress Notes (Signed)
covidobs   Covid-19 Vaccination Clinic  Name:  Beth Mayo    MRN: [id number] DOB: 09-Sep-1951  12/18/2019  [name] was observed post Covid-19 immunization for 15 minutes without incident. She was provided with Vaccine Information Sheet and instruction to access the V-Safe system.   [name] was instructed to call 911 with any severe reactions post vaccine: Marland Kitchen Difficulty breathing  . Swelling of face and throat  . A fast heartbeat  . A bad rash all over body  . Dizziness and weakness   Immunizations Administered    Name Date Dose VIS Date Route   Moderna COVID-19 Vaccine 12/18/2019  1:29 AM 0.5 mL 09/18/2019 Intramuscular   Manufacturer: Moderna   Lot: [id number]   [id number]

## 2020-01-04 ENCOUNTER — Other Ambulatory Visit: Payer: Self-pay | Admitting: Family Medicine

## 2020-01-06 NOTE — Telephone Encounter (Signed)
May have 90-day does need to do a follow-up visit by early summer

## 2020-01-07 NOTE — Telephone Encounter (Signed)
Scheduled 6/15

## 2020-01-15 ENCOUNTER — Ambulatory Visit: Payer: Medicare PPO | Attending: Internal Medicine

## 2020-01-15 DIAGNOSIS — Z23 Encounter for immunization: Secondary | ICD-10-CM

## 2020-01-15 NOTE — Progress Notes (Signed)
   Covid-19 Vaccination Clinic  Name:  Beth Mayo    MRN: [id number] DOB: May 20, 1951  01/15/2020  [name] was observed post Covid-19 immunization for 15 minutes without incident. She was provided with Vaccine Information Sheet and instruction to access the V-Safe system.   [name] was instructed to call 911 with any severe reactions post vaccine: Marland Kitchen Difficulty breathing  . Swelling of face and throat  . A fast heartbeat  . A bad rash all over body  . Dizziness and weakness   Immunizations Administered    Name Date Dose VIS Date Route   Moderna COVID-19 Vaccine 01/15/2020  1:29 PM 0.5 mL 09/18/2019 Intramuscular   Manufacturer: Moderna   Lot: [id number]   [id number]

## 2020-03-10 ENCOUNTER — Other Ambulatory Visit: Payer: Self-pay | Admitting: Family Medicine

## 2020-04-01 ENCOUNTER — Ambulatory Visit: Payer: Medicare PPO | Admitting: Family Medicine

## 2020-04-01 ENCOUNTER — Other Ambulatory Visit: Payer: Self-pay

## 2020-04-01 ENCOUNTER — Ambulatory Visit: Payer: Medicare PPO | Admitting: Nutrition

## 2020-04-01 ENCOUNTER — Encounter: Payer: Self-pay | Admitting: Family Medicine

## 2020-04-01 VITALS — BP 140/86 | Temp 97.9°F | Ht 63.0 in | Wt 207.8 lb

## 2020-04-01 DIAGNOSIS — R739 Hyperglycemia, unspecified: Secondary | ICD-10-CM

## 2020-04-01 DIAGNOSIS — Z23 Encounter for immunization: Secondary | ICD-10-CM | POA: Diagnosis not present

## 2020-04-01 DIAGNOSIS — D229 Melanocytic nevi, unspecified: Secondary | ICD-10-CM | POA: Diagnosis not present

## 2020-04-01 DIAGNOSIS — I10 Essential (primary) hypertension: Secondary | ICD-10-CM

## 2020-04-01 DIAGNOSIS — E785 Hyperlipidemia, unspecified: Secondary | ICD-10-CM

## 2020-04-01 MED ORDER — TRIAMTERENE-HCTZ 37.5-25 MG PO CAPS: ORAL_CAPSULE | ORAL | 1 refills | Status: DC

## 2020-04-01 NOTE — Progress Notes (Signed)
   Subjective:    Patient ID: Beth Mayo, female    DOB: [birth year], 69 y.o.   MRN: [id number]  Hypertension This is a chronic problem. The current episode started more than 1 year ago. Pertinent negatives include no chest pain or shortness of breath. Risk factors for coronary artery disease include post-menopausal state. Treatments tried: dyazide. There are no compliance problems.    Patient also has a mole on top of her head    Review of Systems  Constitutional: Negative for activity change, appetite change and fatigue.  HENT: Negative for congestion and rhinorrhea.   Respiratory: Negative for cough and shortness of breath.   Cardiovascular: Negative for chest pain and leg swelling.  Gastrointestinal: Negative for abdominal pain and diarrhea.  Endocrine: Negative for polydipsia and polyphagia.  Skin: Negative for color change.  Neurological: Negative for dizziness and weakness.  Psychiatric/Behavioral: Negative for behavioral problems and confusion.       Objective:   Physical Exam Vitals reviewed.  Constitutional:      General: She is not in acute distress. HENT:     Head: Normocephalic and atraumatic.  Eyes:     General:        Right eye: No discharge.        Left eye: No discharge.  Neck:     Trachea: No tracheal deviation.  Cardiovascular:     Rate and Rhythm: Normal rate and regular rhythm.     Heart sounds: Normal heart sounds. No murmur heard.   Pulmonary:     Effort: Pulmonary effort is normal. No respiratory distress.     Breath sounds: Normal breath sounds.  Lymphadenopathy:     Cervical: No cervical adenopathy.  Skin:    General: Skin is warm and dry.  Neurological:     Mental Status: She is alert.     Coordination: Coordination normal.  Psychiatric:        Behavior: Behavior normal.      Patient encouraged to walk on a regular basis and try to lose some weight     Assessment & Plan:  1. Hypertension, unspecified type Blood pressure fair  control could be better patient was encouraged to exercise more watch salt follow-up again in several months - Hepatic function panel - Basic metabolic panel  2. Hyperglycemia We will check A1c in the past it has been elevated patient thinks her sugars have been doing okay - Hemoglobin A1c - Hepatic function panel  3. Atypical mole Has a atypical mole on her right upper back referral to dermatology for evaluation she also has a mole on top of her head that they can look at - Ambulatory referral to Dermatology  4. Hyperlipidemia, unspecified hyperlipidemia type Check lipid profile.  May need to be on medication depending on results - Lipid panel - Hepatic function panel  5. Need for vaccination Pneumonia vaccine - Pneumococcal polysaccharide vaccine 23-valent greater than or equal to 2yo subcutaneous/IM I did encourage patient to get Shingrix she will think about it

## 2020-04-01 NOTE — Patient Instructions (Addendum)
Shingrix and shingles prevention: know the facts! This is the new vaccine we talked about if you are interested please let me know and we can order it at your phramacy  Shingrix is a very effective vaccine to prevent shingles.   Shingles is a reactivation of chickenpox -more than 99% of Americans born before 1980 have had chickenpox even if they do not remember it. One in every 10 people who get shingles have severe long-lasting nerve pain as a result.   33 out of a 100 older adults will get shingles if they are unvaccinated.     This vaccine is very important for your health This vaccine is indicated for anyone 50 years or older. You can get this vaccine even if you have already had shingles because you can get the disease more than once in a lifetime.  Your risk for shingles and its complications increases with age.  This vaccine has 2 doses.  The second dose would be 2 to 6 months after the first dose.  If you had Zostavax vaccine in the past you should still get Shingrix. ( Zostavax is only 70% effective and it loses significant strength over a few years .)  This vaccine is given through the pharmacy.  The cost of the vaccine is through your insurance. The pharmacy can inform you of the total costs.  Common side effects including soreness in the arm, some redness and swelling, also some feel fatigue muscle soreness headache low-grade fever.  Side effects typically go away within 2 to 3 days. Remember-the pain from shingles can last a lifetime but these side effects of the vaccine will only last a few days at most. It is very important to get both doses in order to protect yourself fully.   Please get this vaccine at your earliest convenience at your trusted pharmacy.         DASH Eating Plan DASH stands for "Dietary Approaches to Stop Hypertension." The DASH eating plan is a healthy eating plan that has been shown to reduce high blood pressure (hypertension). It may also  reduce your risk for type 2 diabetes, heart disease, and stroke. The DASH eating plan may also help with weight loss. What are tips for following this plan?  General guidelines  Avoid eating more than 2,300 mg (milligrams) of salt (sodium) a day. If you have hypertension, you may need to reduce your sodium intake to 1,500 mg a day.  Limit alcohol intake to no more than 1 drink a day for nonpregnant women and 2 drinks a day for men. One drink equals 12 oz of beer, 5 oz of wine, or 1 oz of hard liquor.  Work with your health care provider to maintain a healthy body weight or to lose weight. Ask what an ideal weight is for you.  Get at least 30 minutes of exercise that causes your heart to beat faster (aerobic exercise) most days of the week. Activities may include walking, swimming, or biking.  Work with your health care provider or diet and nutrition specialist (dietitian) to adjust your eating plan to your individual calorie needs. Reading food labels   Check food labels for the amount of sodium per serving. Choose foods with less than 5 percent of the Daily Value of sodium. Generally, foods with less than 300 mg of sodium per serving fit into this eating plan.  To find whole grains, look for the word "whole" as the first word in the ingredient list. Shopping  Buy products labeled as "low-sodium" or "no salt added."  Buy fresh foods. Avoid canned foods and premade or frozen meals. Cooking  Avoid adding salt when cooking. Use salt-free seasonings or herbs instead of table salt or sea salt. Check with your health care provider or pharmacist before using salt substitutes.  Do not fry foods. Cook foods using healthy methods such as baking, boiling, grilling, and broiling instead.  Cook with heart-healthy oils, such as olive, canola, soybean, or sunflower oil. Meal planning  Eat a balanced diet that includes: ? 5 or more servings of fruits and vegetables each day. At each meal, try to  fill half of your plate with fruits and vegetables. ? Up to 6-8 servings of whole grains each day. ? Less than 6 oz of lean meat, poultry, or fish each day. A 3-oz serving of meat is about the same size as a deck of cards. One egg equals 1 oz. ? 2 servings of low-fat dairy each day. ? A serving of nuts, seeds, or beans 5 times each week. ? Heart-healthy fats. Healthy fats called Omega-3 fatty acids are found in foods such as flaxseeds and coldwater fish, like sardines, salmon, and mackerel.  Limit how much you eat of the following: ? Canned or prepackaged foods. ? Food that is high in trans fat, such as fried foods. ? Food that is high in saturated fat, such as fatty meat. ? Sweets, desserts, sugary drinks, and other foods with added sugar. ? Full-fat dairy products.  Do not salt foods before eating.  Try to eat at least 2 vegetarian meals each week.  Eat more home-cooked food and less restaurant, buffet, and fast food.  When eating at a restaurant, ask that your food be prepared with less salt or no salt, if possible. What foods are recommended? The items listed may not be a complete list. Talk with your dietitian about what dietary choices are best for you. Grains Whole-grain or whole-wheat bread. Whole-grain or whole-wheat pasta. Brown rice. [place]. Bulgur. Whole-grain and low-sodium cereals. Pita bread. Low-fat, low-sodium crackers. Whole-wheat flour tortillas. Vegetables Fresh or frozen vegetables (raw, steamed, roasted, or grilled). Low-sodium or reduced-sodium tomato and vegetable juice. Low-sodium or reduced-sodium tomato sauce and tomato paste. Low-sodium or reduced-sodium canned vegetables. Fruits All fresh, dried, or frozen fruit. Canned fruit in natural juice (without added sugar). Meat and other protein foods Skinless chicken or [place]. Ground chicken or [place]. Pork with fat trimmed off. Fish and seafood. Egg whites. Dried beans, peas, or lentils. Unsalted nuts,  nut butters, and seeds. Unsalted canned beans. Lean cuts of beef with fat trimmed off. Low-sodium, lean deli meat. Dairy Low-fat (1%) or fat-free (skim) milk. Fat-free, low-fat, or reduced-fat cheeses. Nonfat, low-sodium ricotta or cottage cheese. Low-fat or nonfat yogurt. Low-fat, low-sodium cheese. Fats and oils Soft margarine without trans fats. Vegetable oil. Low-fat, reduced-fat, or light mayonnaise and salad dressings (reduced-sodium). Canola, safflower, olive, soybean, and sunflower oils. Avocado. Seasoning and other foods Herbs. Spices. Seasoning mixes without salt. Unsalted popcorn and pretzels. Fat-free sweets. What foods are not recommended? The items listed may not be a complete list. Talk with your dietitian about what dietary choices are best for you. Grains Baked goods made with fat, such as croissants, muffins, or some breads. Dry pasta or rice meal packs. Vegetables Creamed or fried vegetables. Vegetables in a cheese sauce. Regular canned vegetables (not low-sodium or reduced-sodium). Regular canned tomato sauce and paste (not low-sodium or reduced-sodium). Regular tomato and vegetable juice (not  low-sodium or reduced-sodium). Angie Fava. Olives. Fruits Canned fruit in a light or heavy syrup. Fried fruit. Fruit in cream or butter sauce. Meat and other protein foods Fatty cuts of meat. Ribs. Fried meat. Berniece Salines. Sausage. Bologna and other processed lunch meats. Salami. Fatback. Hotdogs. Bratwurst. Salted nuts and seeds. Canned beans with added salt. Canned or smoked fish. Whole eggs or egg yolks. Chicken or [place] with skin. Dairy Whole or 2% milk, cream, and half-and-half. Whole or full-fat cream cheese. Whole-fat or sweetened yogurt. Full-fat cheese. Nondairy creamers. Whipped toppings. Processed cheese and cheese spreads. Fats and oils Butter. Stick margarine. Lard. Shortening. Ghee. Bacon fat. Tropical oils, such as coconut, palm kernel, or palm oil. Seasoning and other  foods Salted popcorn and pretzels. Onion salt, garlic salt, seasoned salt, table salt, and sea salt. Worcestershire sauce. Tartar sauce. Barbecue sauce. Teriyaki sauce. Soy sauce, including reduced-sodium. Steak sauce. Canned and packaged gravies. Fish sauce. Oyster sauce. Cocktail sauce. Horseradish that you find on the shelf. Ketchup. Mustard. Meat flavorings and tenderizers. Bouillon cubes. Hot sauce and Tabasco sauce. Premade or packaged marinades. Premade or packaged taco seasonings. Relishes. Regular salad dressings. Where to find more information:  National Heart, Lung, and [place]: https://wilson-eaton.com/  American Heart Association: www.heart.org Summary  The DASH eating plan is a healthy eating plan that has been shown to reduce high blood pressure (hypertension). It may also reduce your risk for type 2 diabetes, heart disease, and stroke.  With the DASH eating plan, you should limit salt (sodium) intake to 2,300 mg a day. If you have hypertension, you may need to reduce your sodium intake to 1,500 mg a day.  When on the DASH eating plan, aim to eat more fresh fruits and vegetables, whole grains, lean proteins, low-fat dairy, and heart-healthy fats.  Work with your health care provider or diet and nutrition specialist (dietitian) to adjust your eating plan to your individual calorie needs. This information is not intended to replace advice given to you by your health care provider. Make sure you discuss any questions you have with your health care provider. Document Revised: 09/16/2017 Document Reviewed: 09/27/2016 Elsevier Patient Education  2020 [employer].

## 2020-04-14 ENCOUNTER — Encounter: Payer: Self-pay | Admitting: Family Medicine

## 2020-04-15 ENCOUNTER — Ambulatory Visit: Payer: Medicare PPO | Admitting: Nutrition

## 2020-06-05 ENCOUNTER — Other Ambulatory Visit: Payer: Self-pay | Admitting: Family Medicine

## 2020-07-02 ENCOUNTER — Telehealth: Payer: Self-pay | Admitting: Family Medicine

## 2020-07-02 ENCOUNTER — Telehealth (INDEPENDENT_AMBULATORY_CARE_PROVIDER_SITE_OTHER): Payer: Medicare PPO | Admitting: Family Medicine

## 2020-07-02 DIAGNOSIS — I10 Essential (primary) hypertension: Secondary | ICD-10-CM

## 2020-07-02 MED ORDER — PANTOPRAZOLE SODIUM 40 MG PO TBEC
40.0000 mg | DELAYED_RELEASE_TABLET | Freq: Every day | ORAL | 1 refills | Status: DC
Start: 2020-07-02 — End: 2021-02-13

## 2020-07-02 MED ORDER — TRIAMTERENE-HCTZ 37.5-25 MG PO CAPS
ORAL_CAPSULE | ORAL | 1 refills | Status: DC
Start: 2020-07-02 — End: 2021-07-29

## 2020-07-02 NOTE — Progress Notes (Signed)
° °  Subjective:    Patient ID: Beth Mayo, female    DOB: [birth year], 69 y.o.   MRN: [id number]  Hypertension This is a chronic problem. There are no compliance problems.   pt taking Dyazide 37.5-25 mg daily. No issues or concerns. Patient relates blood pressure doing well She is watching her diet Trying to lose some weight Staying physically active Trying to eat healthier  Virtual Visit via Telephone Note  I connected with Beth Mayo on 07/02/20 at  3:00 PM EDT by telephone and verified that I am speaking with the correct person using two identifiers.  Location: Patient: home Provider: office   I discussed the limitations, risks, security and privacy concerns of performing an evaluation and management service by telephone and the availability of in person appointments. I also discussed with the patient that there may be a patient responsible charge related to this service. The patient expressed understanding and agreed to proceed.   History of Present Illness:    Observations/Objective:   Assessment and Plan:   Follow Up Instructions:    I discussed the assessment and treatment plan with the patient. The patient was provided an opportunity to ask questions and all were answered. The patient agreed with the plan and demonstrated an understanding of the instructions.   The patient was advised to call back or seek an in-person evaluation if the symptoms worsen or if the condition fails to improve as anticipated.  I provided 15 minutes of non-face-to-face time during this encounter. 20 minutes with chart review and documentation    Patient defers on flu shot    Review of Systems    Please see above denies any chest tightness pressure pain o Objective:   Physical Exam   Today's visit was via telephone Physical exam was not possible for this visit      Assessment & Plan:  HTN good control continue current medication Patient is due for lab work I encouraged  her to get this completed she states she will do so in the next 2 weeks Continue to stay physically active exercise try to lose weight Follow-up by spring time sooner if any problems

## 2020-07-02 NOTE — Telephone Encounter (Signed)
Ms. ananiah, maciolek are scheduled for a virtual visit with your provider today.    Just as we do with appointments in the office, we must obtain your consent to participate.  Your consent will be active for this visit and any virtual visit you may have with one of our providers in the next 365 days.    If you have a MyChart account, I can also send a copy of this consent to you electronically.  All virtual visits are billed to your insurance company just like a traditional visit in the office.  As this is a virtual visit, video technology does not allow for your provider to perform a traditional examination.  This may limit your provider's ability to fully assess your condition.  If your provider identifies any concerns that need to be evaluated in person or the need to arrange testing such as labs, EKG, etc, we will make arrangements to do so.    Although advances in technology are sophisticated, we cannot ensure that it will always work on either your end or our end.  If the connection with a video visit is poor, we may have to switch to a telephone visit.  With either a video or telephone visit, we are not always able to ensure that we have a secure connection.   I need to obtain your verbal consent now.   Are you willing to proceed with your visit today?   CRYSTOL WALPOLE has provided verbal consent on 07/02/2020 for a virtual visit (video or telephone).   Vicente Males, LPN 1/74/0814  4:81 PM

## 2020-07-29 ENCOUNTER — Encounter: Payer: Self-pay | Admitting: Dermatology

## 2020-07-29 ENCOUNTER — Ambulatory Visit: Payer: Medicare PPO | Admitting: Dermatology

## 2020-07-29 ENCOUNTER — Other Ambulatory Visit: Payer: Self-pay

## 2020-07-29 DIAGNOSIS — Z1283 Encounter for screening for malignant neoplasm of skin: Secondary | ICD-10-CM | POA: Diagnosis not present

## 2020-07-29 DIAGNOSIS — L821 Other seborrheic keratosis: Secondary | ICD-10-CM

## 2020-08-25 ENCOUNTER — Telehealth: Payer: Self-pay

## 2020-08-25 NOTE — Telephone Encounter (Signed)
Patient is requesting appointment for mammogram.She tried to schedule this morning at [facility] and was told to call primary care office to get it scheduled. Please advise

## 2020-08-25 NOTE — Telephone Encounter (Signed)
Patient states she is scheduling it to be done at GYN office with her physical in January- [facility](normally has done there) and doesn't need [place] to schedule at [facility]. Patient will call us back if any problems

## 2020-10-06 DIAGNOSIS — Z1231 Encounter for screening mammogram for malignant neoplasm of breast: Secondary | ICD-10-CM | POA: Diagnosis not present

## 2020-10-21 ENCOUNTER — Encounter: Payer: Self-pay | Admitting: Dermatology

## 2020-10-21 NOTE — Addendum Note (Signed)
Addended by: Janalyn Harder on: 10/21/2020 07:42 AM   Modules accepted: Level of Service

## 2020-10-21 NOTE — Progress Notes (Addendum)
   Follow-Up Visit   Subjective  Beth Mayo is a 70 y.o. female who presents for the following: Skin Problem (right shoulder & scalp- PCP said they are "black").  Growths Location: Right shoulder and scalp Duration:  Quality: Stable Associated Signs/Symptoms: Modifying Factors:  Severity:  Timing: Context:   Objective  Well appearing patient in no apparent distress; mood and affect are within normal limits. Objective  Mid Back: No atypical moles, melanoma, or nonmobile skin cancer on back, shoulders, upper chest, head and neck.  Objective  Neck - Anterior, Scalp: Textured monochrome dark brown 4 mm papules compatible with seborrheic keratoses (dermoscopy confirms shoulder lesion).   A focused examination was performed including Head, neck, back, shoulders.. Relevant physical exam findings are noted in the Assessment and Plan.   Assessment & Plan    Skin exam for malignant neoplasm Mid Back  Encouraged to self examine her skin twice annually.  Recheck with dermatologist as needed.  Seborrheic keratosis (2) Neck - Anterior; Scalp  May leave if stable.     I, Janalyn Harder, MD, have reviewed all documentation for this visit.  The documentation on 10/30/20 for the exam, diagnosis, procedures, and orders are all accurate and complete.

## 2021-02-12 ENCOUNTER — Other Ambulatory Visit: Payer: Self-pay | Admitting: Family Medicine

## 2021-04-21 ENCOUNTER — Other Ambulatory Visit: Payer: Self-pay

## 2021-04-21 ENCOUNTER — Ambulatory Visit (INDEPENDENT_AMBULATORY_CARE_PROVIDER_SITE_OTHER): Payer: Medicare PPO

## 2021-04-21 DIAGNOSIS — Z0000 Encounter for general adult medical examination without abnormal findings: Secondary | ICD-10-CM

## 2021-04-21 NOTE — Patient Instructions (Signed)
Beth Mayo , Thank you for taking time to come for your Medicare Wellness Visit. I appreciate your ongoing commitment to your health goals. Please review the following plan we discussed and let me know if I can assist you in the future.   Screening recommendations/referrals: Colonoscopy: Up to date, next due 10/19/2022 Mammogram: Please have your GYN fax over your most recent Mammogram so that we may update your chart  Bone Density: Up to date, next due 12/14/2023 Recommended yearly ophthalmology/optometry visit for glaucoma screening and checkup Recommended yearly dental visit for hygiene and checkup  Vaccinations: Influenza vaccine: Patient declined  Pneumococcal vaccine: Completed series  Tdap vaccine: Currently due, you may await and injury to receive  Shingles vaccine: Currently due for Shingrix, if you would like to receive we recommend that you do so at your local pharmacy     Advanced directives: Advance directive discussed with you today. Even though you declined this today please call our office should you change your mind and we can give you the proper paperwork for you to fill out.   Conditions/risks identified: None   Next appointment: None    Preventive Care 65 Years and Older, Female Preventive care refers to lifestyle choices and visits with your health care provider that can promote health and wellness. What does preventive care include? A yearly physical exam. This is also called an annual well check. Dental exams once or twice a year. Routine eye exams. Ask your health care provider how often you should have your eyes checked. Personal lifestyle choices, including: Daily care of your teeth and gums. Regular physical activity. Eating a healthy diet. Avoiding tobacco and drug use. Limiting alcohol use. Practicing safe sex. Taking low-dose aspirin every day. Taking vitamin and mineral supplements as recommended by your health care provider. What happens during an  annual well check? The services and screenings done by your health care provider during your annual well check will depend on your age, overall health, lifestyle risk factors, and family history of disease. Counseling  Your health care provider may ask you questions about your: Alcohol use. Tobacco use. Drug use. Emotional well-being. Home and relationship well-being. Sexual activity. Eating habits. History of falls. Memory and ability to understand (cognition). Work and work Statistician. Reproductive health. Screening  You may have the following tests or measurements: Height, weight, and BMI. Blood pressure. Lipid and cholesterol levels. These may be checked every 5 years, or more frequently if you are over 51 years old. Skin check. Lung cancer screening. You may have this screening every year starting at age 45 if you have a 30-pack-year history of smoking and currently smoke or have quit within the past 15 years. Fecal occult blood test (FOBT) of the stool. You may have this test every year starting at age 7. Flexible sigmoidoscopy or colonoscopy. You may have a sigmoidoscopy every 5 years or a colonoscopy every 10 years starting at age 58. Hepatitis C blood test. Hepatitis B blood test. Sexually transmitted disease (STD) testing. Diabetes screening. This is done by checking your blood sugar (glucose) after you have not eaten for a while (fasting). You may have this done every 1-3 years. Bone density scan. This is done to screen for osteoporosis. You may have this done starting at age 78. Mammogram. This may be done every 1-2 years. Talk to your health care provider about how often you should have regular mammograms. Talk with your health care provider about your test results, treatment options, and if necessary,  the need for more tests. Vaccines  Your health care provider may recommend certain vaccines, such as: Influenza vaccine. This is recommended every year. Tetanus,  diphtheria, and acellular pertussis (Tdap, Td) vaccine. You may need a Td booster every 10 years. Zoster vaccine. You may need this after age 27. Pneumococcal 13-valent conjugate (PCV13) vaccine. One dose is recommended after age 51. Pneumococcal polysaccharide (PPSV23) vaccine. One dose is recommended after age 70. Talk to your health care provider about which screenings and vaccines you need and how often you need them. This information is not intended to replace advice given to you by your health care provider. Make sure you discuss any questions you have with your health care provider. Document Released: 10/31/2015 Document Revised: 06/23/2016 Document Reviewed: 08/05/2015 Elsevier Interactive Patient Education  2017 [place] Prevention in the Home Falls can cause injuries. They can happen to people of all ages. There are many things you can do to make your home safe and to help prevent falls. What can I do on the outside of my home? Regularly fix the edges of walkways and driveways and fix any cracks. Remove anything that might make you trip as you walk through a door, such as a raised step or threshold. Trim any bushes or trees on the path to your home. Use bright outdoor lighting. Clear any walking paths of anything that might make someone trip, such as rocks or tools. Regularly check to see if handrails are loose or broken. Make sure that both sides of any steps have handrails. Any raised decks and porches should have guardrails on the edges. Have any leaves, snow, or ice cleared regularly. Use sand or salt on walking paths during winter. Clean up any spills in your garage right away. This includes oil or grease spills. What can I do in the bathroom? Use night lights. Install grab bars by the toilet and in the tub and shower. Do not use towel bars as grab bars. Use non-skid mats or decals in the tub or shower. If you need to sit down in the shower, use a plastic,  non-slip stool. Keep the floor dry. Clean up any water that spills on the floor as soon as it happens. Remove soap buildup in the tub or shower regularly. Attach bath mats securely with double-sided non-slip rug tape. Do not have throw rugs and other things on the floor that can make you trip. What can I do in the bedroom? Use night lights. Make sure that you have a light by your bed that is easy to reach. Do not use any sheets or blankets that are too big for your bed. They should not hang down onto the floor. Have a firm chair that has side arms. You can use this for support while you get dressed. Do not have throw rugs and other things on the floor that can make you trip. What can I do in the kitchen? Clean up any spills right away. Avoid walking on wet floors. Keep items that you use a lot in easy-to-reach places. If you need to reach something above you, use a strong step stool that has a grab bar. Keep electrical cords out of the way. Do not use floor polish or wax that makes floors slippery. If you must use wax, use non-skid floor wax. Do not have throw rugs and other things on the floor that can make you trip. What can I do with my stairs? Do not leave any items on  the stairs. Make sure that there are handrails on both sides of the stairs and use them. Fix handrails that are broken or loose. Make sure that handrails are as long as the stairways. Check any carpeting to make sure that it is firmly attached to the stairs. Fix any carpet that is loose or worn. Avoid having throw rugs at the top or bottom of the stairs. If you do have throw rugs, attach them to the floor with carpet tape. Make sure that you have a light switch at the top of the stairs and the bottom of the stairs. If you do not have them, ask someone to add them for you. What else can I do to help prevent falls? Wear shoes that: Do not have high heels. Have rubber bottoms. Are comfortable and fit you well. Are closed  at the toe. Do not wear sandals. If you use a stepladder: Make sure that it is fully opened. Do not climb a closed stepladder. Make sure that both sides of the stepladder are locked into place. Ask someone to hold it for you, if possible. Clearly mark and make sure that you can see: Any grab bars or handrails. First and last steps. Where the edge of each step is. Use tools that help you move around (mobility aids) if they are needed. These include: Canes. Walkers. Scooters. Crutches. Turn on the lights when you go into a dark area. Replace any light bulbs as soon as they burn out. Set up your furniture so you have a clear path. Avoid moving your furniture around. If any of your floors are uneven, fix them. If there are any pets around you, be aware of where they are. Review your medicines with your doctor. Some medicines can make you feel dizzy. This can increase your chance of falling. Ask your doctor what other things that you can do to help prevent falls. This information is not intended to replace advice given to you by your health care provider. Make sure you discuss any questions you have with your health care provider. Document Released: 07/31/2009 Document Revised: 03/11/2016 Document Reviewed: 11/08/2014 Elsevier Interactive Patient Education  2017 [employer].

## 2021-04-21 NOTE — Progress Notes (Addendum)
Subjective:   Beth Mayo is a 70 y.o. female who presents for an Initial Medicare Annual Wellness Visit.  I connected with Beth Mayo  today by telephone and verified that I am speaking with the correct person using two identifiers. Location patient: home Location provider: work Persons participating in the virtual visit: patient, provider.   I discussed the limitations, risks, security and privacy concerns of performing an evaluation and management service by telephone and the availability of in person appointments. I also discussed with the patient that there may be a patient responsible charge related to this service. The patient expressed understanding and verbally consented to this telephonic visit.    Interactive audio and video telecommunications were attempted between this provider and patient, however failed, due to patient having technical difficulties OR patient did not have access to video capability.  We continued and completed visit with audio only.     Review of Systems    N/A  Cardiac Risk Factors include: advanced age (>17men, >60 women);hypertension;obesity (BMI >30kg/m2)     Objective:    Today's Vitals   There is no height or weight on file to calculate BMI.  Advanced Directives 04/21/2021 09/27/2016 12/04/2012  Does Patient Have a Medical Advance Directive? No No Patient does not have advance directive;Patient would not like information  Would patient like information on creating a medical advance directive? No - Patient declined - -    Current Medications (verified) Outpatient Encounter Medications as of 04/21/2021  Medication Sig   aspirin EC 81 MG tablet Take 81 mg by mouth daily. Swallow whole.   Biotin 10000 MCG TABS Take by mouth daily.   cetirizine (ZYRTEC) 10 MG tablet Take 10 mg by mouth daily.   Cholecalciferol (VITAMIN D3) 3000 units TABS Take by mouth daily.   pantoprazole (PROTONIX) 40 MG tablet TAKE 1 TABLET BY MOUTH EVERY DAY    triamterene-hydrochlorothiazide (DYAZIDE) 37.5-25 MG capsule TAKE 1 CAPSULE BY MOUTH EVERY DAY   [DISCONTINUED] KETOCONAZOLE-CLEANSER EX Apply topically daily.   No facility-administered encounter medications on file as of 04/21/2021.    Allergies (verified) Ace inhibitors, Codeine, and Phenobarbital   History: Past Medical History:  Diagnosis Date   Angio-edema    Chronic kidney disease    kidney stones   GERD (gastroesophageal reflux disease)    Headache(784.0)    migraines   Hypertension    Spinal headache    Urticaria    Past Surgical History:  Procedure Laterality Date   BREAST SURGERY     breast reduction   CESAREAN SECTION     X2   COLONOSCOPY  01/18/2008     SLF: Moderate internal hemorrhoids.Otherwise no polyps, masses inflammatory changes, diverticular  AVMs.   DILATION AND CURETTAGE OF UTERUS     for miscarriage   ESOPHAGOGASTRODUODENOSCOPY N/A 12/04/2012   TDD:UKGURKYH ring was found at the gastroesophageal junction/Small hiatal hernia/ MODERATE Erosive gastritis/MILD DUODENITIS in the bulb and second portion of the duodenum/NEW ONSET DYSPEPSIA DUE TO NSAID INDUCED GASTRITIS/DUODENITIS, negative path   rotator cuff right Right    TONSILLECTOMY     TUBAL LIGATION     Family History  Problem Relation Age of Onset   Colon cancer Neg Hx    Allergic rhinitis Neg Hx    Angioedema Neg Hx    Asthma Neg Hx    Atopy Neg Hx    Eczema Neg Hx    Immunodeficiency Neg Hx    Urticaria Neg Hx    Social History  Socioeconomic History   Marital status: Married    Spouse name: Not on file   Number of children: Not on file   Years of education: Not on file   Highest education level: Not on file  Occupational History   Occupation: retired    Comment: Education officer, museum, 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  Tobacco Use   Smoking status: Never   Smokeless tobacco: Never  Substance and Sexual Activity   Alcohol use: Yes    Comment: socially   Drug use: No   Sexual activity: Not on file   Other Topics Concern   Not on file  Social History Narrative   Not on file   Social Determinants of Health   Financial Resource Strain: Low Risk    Difficulty of Paying Living Expenses: Not hard at all  Food Insecurity: No Food Insecurity   Worried About Charity fundraiser in the Last Year: Never true   Arboriculturist in the Last Year: Never true  Transportation Needs: No Transportation Needs   Lack of Transportation (Medical): No   Lack of Transportation (Non-Medical): No  Physical Activity: Insufficiently Active   Days of Exercise per Week: 4 days   Minutes of Exercise per Session: 30 min  Stress: No Stress Concern Present   Feeling of Stress : Not at all  Social Connections: Socially Integrated   Frequency of Communication with Friends and Family: More than three times a week   Frequency of Social Gatherings with Friends and Family: Once a week   Attends Religious Services: More than 4 times per year   Active Member of [employer] or Organizations: Yes   Attends Archivist Meetings: 1 to 4 times per year   Marital Status: Married    Tobacco Counseling Counseling given: Not Answered   Clinical Intake:  Pre-visit preparation completed: Yes  Pain : No/denies pain     Nutritional Risks: None Diabetes: No  How often do you need to have someone help you when you read instructions, pamphlets, or other written materials from your doctor or pharmacy?: 1 - Never What is the last grade level you completed in school?: College  Diabetic?No   Interpreter Needed?: No  Information entered by :: [employer] of Daily Living In your present state of health, do you have any difficulty performing the following activities: 04/21/2021  Hearing? N  Vision? N  Difficulty concentrating or making decisions? N  Walking or climbing stairs? N  Dressing or bathing? N  Doing errands, shopping? N  Preparing Food and eating ? N  Using the Toilet? N  In the past six  months, have you accidently leaked urine? N  Do you have problems with loss of bowel control? N  Managing your Medications? N  Managing your Finances? N  Housekeeping or managing your Housekeeping? N  Some recent data might be hidden    Patient Care Team: Kathyrn Drown, MD as PCP - General (Family Medicine) Danie Binder, MD (Inactive) as Attending Physician (Gastroenterology)  Indicate any recent Medical Services you may have received from other than [facility] providers in the past year (date may be approximate).     Assessment:   This is a routine wellness examination for Jonnae.  Hearing/Vision screen Vision Screening - Comments:: Patient states she gets her eyes examined once per year. Currently wears reading glasses   Dietary issues and exercise activities discussed: Current Exercise Habits: Home exercise routine, Type of exercise: walking, Time (Minutes): 30,  Frequency (Times/Week): 4, Weekly Exercise (Minutes/Week): 120, Intensity: Mild, Exercise limited by: None identified   Goals Addressed             This Visit's Progress    DIET - EAT MORE FRUITS AND VEGETABLES       DIET - INCREASE LEAN PROTEINS       Exercise 150 min/wk Moderate Activity       Weight (lb) < 160 lb (72.6 kg)         Depression Screen PHQ 2/9 Scores 04/21/2021 05/09/2019 01/04/2019 11/17/2017 01/27/2015  PHQ - 2 Score 0 0 0 0 0  PHQ- 9 Score - - - 2 -    Fall Risk Fall Risk  04/21/2021 04/01/2020 05/09/2019 01/04/2019 01/27/2015  Falls in the past year? 0 0 0 0 No  Number falls in past yr: 0 - 0 - -  Injury with Fall? 0 - - - -  Risk for fall due to : No Fall Risks - - - -  Follow up Falls evaluation completed;Falls prevention discussed Falls evaluation completed - - -    FALL RISK PREVENTION PERTAINING TO THE HOME:  Any stairs in or around the home? No  If so, are there any without handrails? No  Home free of loose throw rugs in walkways, pet beds, electrical cords, etc? Yes  Adequate lighting in  your home to reduce risk of falls? Yes   ASSISTIVE DEVICES UTILIZED TO PREVENT FALLS:  Life alert? No  Use of a cane, walker or w/c? No  Grab bars in the bathroom? Yes  Shower chair or bench in shower? Yes  Elevated toilet seat or a handicapped toilet? Yes   Cognitive Function:  Normal cognitive status assessed by direct observation by this Nurse Health Advisor. No abnormalities found.        Immunizations Immunization History  Administered Date(s) Administered   [employer] Vaccination 12/18/2019, 01/15/2020, 09/24/2020, 02/27/2021   Pneumococcal Conjugate-13 11/28/2018   Pneumococcal Polysaccharide-23 04/01/2020    TDAP status: Due, Education has been provided regarding the importance of this vaccine. Advised may receive this vaccine at local pharmacy or Health Dept. Aware to provide a copy of the vaccination record if obtained from local pharmacy or Health Dept. Verbalized acceptance and understanding.  Flu Vaccine status: Due, Education has been provided regarding the importance of this vaccine. Advised may receive this vaccine at local pharmacy or Health Dept. Aware to provide a copy of the vaccination record if obtained from local pharmacy or Health Dept. Verbalized acceptance and understanding.  Pneumococcal vaccine status: Up to date  Covid-19 vaccine status: Completed vaccines  Qualifies for Shingles Vaccine? Yes   Zostavax completed No   Shingrix Completed?: No.    Education has been provided regarding the importance of this vaccine. Patient has been advised to call insurance company to determine out of pocket expense if they have not yet received this vaccine. Advised may also receive vaccine at local pharmacy or Health Dept. Verbalized acceptance and understanding.  Screening Tests Health Maintenance  Topic Date Due   URINE MICROALBUMIN  Never done   TETANUS/TDAP  Never done   Zoster Vaccines- Shingrix (1 of 2) Never done   HEMOGLOBIN A1C  05/15/2019    OPHTHALMOLOGY EXAM  07/19/2019   FOOT EXAM  11/29/2019   MAMMOGRAM  07/18/2020   COVID-19 Vaccine (5 - Booster for Moderna series) 06/30/2021   COLONOSCOPY (Pts 45-8yrs Insurance coverage will need to be confirmed)  10/19/2022   DEXA SCAN  Completed   Hepatitis C Screening  Completed   PNA vac Low Risk Adult  Completed   HPV VACCINES  Aged Out   INFLUENZA VACCINE  Discontinued    Health Maintenance  Health Maintenance Due  Topic Date Due   URINE MICROALBUMIN  Never done   TETANUS/TDAP  Never done   Zoster Vaccines- Shingrix (1 of 2) Never done   HEMOGLOBIN A1C  05/15/2019   OPHTHALMOLOGY EXAM  07/19/2019   FOOT EXAM  11/29/2019   MAMMOGRAM  07/18/2020    Colorectal cancer screening: Type of screening: Colonoscopy. Completed 10/19/2012. Repeat every 10 years  Mammogram status: Ordered 04/21/2021. Pt provided with contact info and advised to call to schedule appt.   Bone Density status: Completed 12/13/2018. Results reflect: Bone density results: NORMAL. Repeat every 5 years.  Lung Cancer Screening: (Low Dose CT Chest recommended if Age 34-80 years, 30 pack-year currently smoking OR have quit w/in 15years.) does not qualify.   Lung Cancer Screening Referral: N/A   Additional Screening:  Hepatitis C Screening: does qualify; Completed 11/14/2018  Vision Screening: Recommended annual ophthalmology exams for early detection of glaucoma and other disorders of the eye. Is the patient up to date with their annual eye exam?  Yes  Who is the provider or what is the name of the office in which the patient attends annual eye exams? Dr. Jorja Loa  If pt is not established with a provider, would they like to be referred to a provider to establish care? No .   Dental Screening: Recommended annual dental exams for proper oral hygiene  Community Resource Referral / Chronic Care Management: CRR required this visit?  No   CCM required this visit?  No      Plan:     I have personally  reviewed and noted the following in the patient's chart:   Medical and social history Use of alcohol, tobacco or illicit drugs  Current medications and supplements including opioid prescriptions. Patient is not currently taking opioid prescriptions. Functional ability and status Nutritional status Physical activity Advanced directives List of other physicians Hospitalizations, surgeries, and ER visits in previous 12 months Vitals Screenings to include cognitive, depression, and falls Referrals and appointments  In addition, I have reviewed and discussed with patient certain preventive protocols, quality metrics, and best practice recommendations. A written personalized care plan for preventive services as well as general preventive health recommendations were provided to patient.     Ofilia Neas, LPN   04/22/1606   Nurse Notes: None

## 2021-05-09 ENCOUNTER — Telehealth: Payer: Self-pay | Admitting: Family Medicine

## 2021-05-09 DIAGNOSIS — E785 Hyperlipidemia, unspecified: Secondary | ICD-10-CM

## 2021-05-09 DIAGNOSIS — I10 Essential (primary) hypertension: Secondary | ICD-10-CM

## 2021-05-09 DIAGNOSIS — Z79899 Other long term (current) drug therapy: Secondary | ICD-10-CM

## 2021-05-12 NOTE — Telephone Encounter (Signed)
Sent mychart messsage.

## 2021-06-24 DIAGNOSIS — Z833 Family history of diabetes mellitus: Secondary | ICD-10-CM | POA: Diagnosis not present

## 2021-06-24 DIAGNOSIS — Z811 Family history of alcohol abuse and dependence: Secondary | ICD-10-CM | POA: Diagnosis not present

## 2021-06-24 DIAGNOSIS — I10 Essential (primary) hypertension: Secondary | ICD-10-CM | POA: Diagnosis not present

## 2021-06-24 DIAGNOSIS — K219 Gastro-esophageal reflux disease without esophagitis: Secondary | ICD-10-CM | POA: Diagnosis not present

## 2021-06-24 DIAGNOSIS — Z823 Family history of stroke: Secondary | ICD-10-CM | POA: Diagnosis not present

## 2021-06-24 DIAGNOSIS — Z8249 Family history of ischemic heart disease and other diseases of the circulatory system: Secondary | ICD-10-CM | POA: Diagnosis not present

## 2021-06-24 DIAGNOSIS — Z6837 Body mass index (BMI) 37.0-37.9, adult: Secondary | ICD-10-CM | POA: Diagnosis not present

## 2021-06-25 NOTE — Telephone Encounter (Signed)
Sent my chart message 7/26 and called 8/15 no response

## 2021-06-28 NOTE — Telephone Encounter (Signed)
Patient needs to do lipid, liver, metabolic 7 Recommend send her a letter regarding the need for her to do a follow-up visit this fall and her lab work I recommend that this is sent via certified mail thank you

## 2021-06-29 ENCOUNTER — Encounter: Payer: Self-pay | Admitting: Family Medicine

## 2021-06-29 NOTE — Telephone Encounter (Signed)
Lab orders placed and letter made. Printed and mailed to patient.

## 2021-07-02 ENCOUNTER — Other Ambulatory Visit: Payer: Self-pay | Admitting: Family Medicine

## 2021-07-03 NOTE — Telephone Encounter (Signed)
Sent my chart message again to schedule appointment

## 2021-07-08 NOTE — Telephone Encounter (Signed)
Left message to schedule appointment 9/22

## 2021-07-08 NOTE — Telephone Encounter (Signed)
Schedule appointment for 10/12

## 2021-07-27 DIAGNOSIS — Z79899 Other long term (current) drug therapy: Secondary | ICD-10-CM | POA: Diagnosis not present

## 2021-07-27 DIAGNOSIS — E785 Hyperlipidemia, unspecified: Secondary | ICD-10-CM | POA: Diagnosis not present

## 2021-07-27 DIAGNOSIS — I10 Essential (primary) hypertension: Secondary | ICD-10-CM | POA: Diagnosis not present

## 2021-07-28 LAB — BASIC METABOLIC PANEL
BUN/Creatinine Ratio: 9 — ABNORMAL LOW (ref 12–28)
BUN: 9 mg/dL (ref 8–27)
CO2: 24 mmol/L (ref 20–29)
Calcium: 9.5 mg/dL (ref 8.7–10.3)
Chloride: 103 mmol/L (ref 96–106)
Creatinine, Ser: 0.99 mg/dL (ref 0.57–1.00)
Glucose: 133 mg/dL — ABNORMAL HIGH (ref 70–99)
Potassium: 4 mmol/L (ref 3.5–5.2)
Sodium: 144 mmol/L (ref 134–144)
eGFR: 62 mL/min/{1.73_m2} (ref >59)

## 2021-07-28 LAB — HEPATIC FUNCTION PANEL
ALT: 14 IU/L (ref 0–32)
AST: 18 IU/L (ref 0–40)
Albumin: 4.6 g/dL (ref 3.8–4.8)
Alkaline Phosphatase: 104 IU/L (ref 44–121)
Bilirubin Total: 0.5 mg/dL (ref 0.0–1.2)
Bilirubin, Direct: 0.13 mg/dL (ref 0.00–0.40)
Total Protein: 7 g/dL (ref 6.0–8.5)

## 2021-07-28 LAB — LIPID PANEL
Chol/HDL Ratio: 5.1 ratio — ABNORMAL HIGH (ref 0.0–4.4)
Cholesterol, Total: 195 mg/dL (ref 100–199)
HDL: 38 mg/dL — ABNORMAL LOW (ref >39)
LDL Chol Calc (NIH): 117 mg/dL — ABNORMAL HIGH (ref 0–99)
Triglycerides: 230 mg/dL — ABNORMAL HIGH (ref 0–149)
VLDL Cholesterol Cal: 40 mg/dL (ref 5–40)

## 2021-07-29 ENCOUNTER — Other Ambulatory Visit: Payer: Self-pay

## 2021-07-29 ENCOUNTER — Ambulatory Visit: Payer: Medicare PPO | Admitting: Family Medicine

## 2021-07-29 ENCOUNTER — Encounter: Payer: Self-pay | Admitting: Family Medicine

## 2021-07-29 VITALS — BP 140/88 | Temp 97.2°F | Wt 203.2 lb

## 2021-07-29 DIAGNOSIS — E785 Hyperlipidemia, unspecified: Secondary | ICD-10-CM | POA: Diagnosis not present

## 2021-07-29 DIAGNOSIS — R739 Hyperglycemia, unspecified: Secondary | ICD-10-CM | POA: Diagnosis not present

## 2021-07-29 DIAGNOSIS — I10 Essential (primary) hypertension: Secondary | ICD-10-CM

## 2021-07-29 MED ORDER — TRIAMTERENE-HCTZ 37.5-25 MG PO CAPS
ORAL_CAPSULE | ORAL | 1 refills | Status: DC
Start: 2021-07-29 — End: 2022-02-09

## 2021-07-29 NOTE — Patient Instructions (Addendum)
Please work hard on diet and walking Keep up the good work!!  It would be important to repeat your blood work in mid December or early January to specifically look at your A1c.  My hopes is that with healthy eating and regular activity you can get this under control.  There is a possibility that we will need to add a medication.  Please let us know if any problems or issues  We will see you back in the office in approximately 6 months  Thanks-Hiroko Tregre

## 2021-07-29 NOTE — Progress Notes (Signed)
   Subjective:    Patient ID: Beth Mayo, female    DOB: 31-Oct-1950, 70 y.o.   MRN: [id number]  HPI Pt here for follow up on HTN. Pt is checking blood pressure at home. No issues. Taking Dyazide 37.5-25 mg daily.  Pt is wanting to know if she should continue aspirin 81 mg.  She is making improvements she is walking watching diet losing some weight states she could be doing a better job of walking  Review of Systems     Objective:   Physical Exam  Lungs clear heart regular pulse normal extremities no edema skin warm dry      Assessment & Plan:  81 mg aspirin may be stopped I am concerned about her glucose healthy diet regular activity recommended and recommend repeating lab work in 8 to 12 weeks to see if her A1c looks good  If A1c in diabetic range patient will need medication plus also medicine for cholesterol

## 2021-08-31 ENCOUNTER — Other Ambulatory Visit: Payer: Self-pay | Admitting: Family Medicine

## 2021-09-14 ENCOUNTER — Other Ambulatory Visit: Payer: Self-pay | Admitting: Family Medicine

## 2021-09-28 LAB — HM PAP SMEAR: HM Pap smear: NEGATIVE

## 2021-10-08 DIAGNOSIS — Z01419 Encounter for gynecological examination (general) (routine) without abnormal findings: Secondary | ICD-10-CM | POA: Diagnosis not present

## 2021-10-08 DIAGNOSIS — Z124 Encounter for screening for malignant neoplasm of cervix: Secondary | ICD-10-CM | POA: Diagnosis not present

## 2021-10-28 DIAGNOSIS — Z1231 Encounter for screening mammogram for malignant neoplasm of breast: Secondary | ICD-10-CM | POA: Diagnosis not present

## 2021-10-28 LAB — HM MAMMOGRAPHY: HM Mammogram: NORMAL (ref 0–4)

## 2021-10-31 DIAGNOSIS — Z833 Family history of diabetes mellitus: Secondary | ICD-10-CM | POA: Diagnosis not present

## 2021-10-31 DIAGNOSIS — Z823 Family history of stroke: Secondary | ICD-10-CM | POA: Diagnosis not present

## 2021-10-31 DIAGNOSIS — E669 Obesity, unspecified: Secondary | ICD-10-CM | POA: Diagnosis not present

## 2021-10-31 DIAGNOSIS — Z791 Long term (current) use of non-steroidal anti-inflammatories (NSAID): Secondary | ICD-10-CM | POA: Diagnosis not present

## 2021-10-31 DIAGNOSIS — M1990 Unspecified osteoarthritis, unspecified site: Secondary | ICD-10-CM | POA: Diagnosis not present

## 2021-10-31 DIAGNOSIS — K219 Gastro-esophageal reflux disease without esophagitis: Secondary | ICD-10-CM | POA: Diagnosis not present

## 2021-10-31 DIAGNOSIS — Z8249 Family history of ischemic heart disease and other diseases of the circulatory system: Secondary | ICD-10-CM | POA: Diagnosis not present

## 2021-10-31 DIAGNOSIS — I10 Essential (primary) hypertension: Secondary | ICD-10-CM | POA: Diagnosis not present

## 2021-10-31 DIAGNOSIS — Z6834 Body mass index (BMI) 34.0-34.9, adult: Secondary | ICD-10-CM | POA: Diagnosis not present

## 2021-11-08 ENCOUNTER — Other Ambulatory Visit: Payer: Self-pay | Admitting: Family Medicine

## 2021-11-22 ENCOUNTER — Telehealth: Payer: Self-pay | Admitting: Family Medicine

## 2021-11-22 DIAGNOSIS — I10 Essential (primary) hypertension: Secondary | ICD-10-CM

## 2021-11-22 DIAGNOSIS — R739 Hyperglycemia, unspecified: Secondary | ICD-10-CM

## 2021-11-22 DIAGNOSIS — Z79899 Other long term (current) drug therapy: Secondary | ICD-10-CM

## 2021-11-22 DIAGNOSIS — E785 Hyperlipidemia, unspecified: Secondary | ICD-10-CM

## 2021-11-22 NOTE — Telephone Encounter (Signed)
Nurses-patient is due for lipid, liver, metabolic 7, W7K Hypertension, fasting hyperglycemia  Please inform the patient she is due for blood work this spring along with an office visit.  We can go ahead and put the orders in now she can go ahead and schedule for April or May at the latest

## 2021-11-23 NOTE — Telephone Encounter (Signed)
Blood work ordered in Epic. Patient notified. 

## 2021-12-04 DIAGNOSIS — R739 Hyperglycemia, unspecified: Secondary | ICD-10-CM | POA: Diagnosis not present

## 2021-12-04 DIAGNOSIS — E785 Hyperlipidemia, unspecified: Secondary | ICD-10-CM | POA: Diagnosis not present

## 2021-12-04 DIAGNOSIS — I10 Essential (primary) hypertension: Secondary | ICD-10-CM | POA: Diagnosis not present

## 2021-12-04 DIAGNOSIS — Z79899 Other long term (current) drug therapy: Secondary | ICD-10-CM | POA: Diagnosis not present

## 2021-12-05 LAB — HEPATIC FUNCTION PANEL
ALT: 19 IU/L (ref 0–32)
AST: 19 IU/L (ref 0–40)
Albumin: 4.7 g/dL (ref 3.8–4.8)
Alkaline Phosphatase: 102 IU/L (ref 44–121)
Bilirubin Total: 0.5 mg/dL (ref 0.0–1.2)
Bilirubin, Direct: 0.14 mg/dL (ref 0.00–0.40)
Total Protein: 7.1 g/dL (ref 6.0–8.5)

## 2021-12-05 LAB — BASIC METABOLIC PANEL
BUN/Creatinine Ratio: 10 — ABNORMAL LOW (ref 12–28)
BUN: 9 mg/dL (ref 8–27)
CO2: 26 mmol/L (ref 20–29)
Calcium: 9.8 mg/dL (ref 8.7–10.3)
Chloride: 101 mmol/L (ref 96–106)
Creatinine, Ser: 0.9 mg/dL (ref 0.57–1.00)
Glucose: 147 mg/dL — ABNORMAL HIGH (ref 70–99)
Potassium: 4.2 mmol/L (ref 3.5–5.2)
Sodium: 141 mmol/L (ref 134–144)
eGFR: 70 mL/min/{1.73_m2} (ref >59)

## 2021-12-05 LAB — LIPID PANEL
Chol/HDL Ratio: 4.3 ratio (ref 0.0–4.4)
Cholesterol, Total: 190 mg/dL (ref 100–199)
HDL: 44 mg/dL (ref >39)
LDL Chol Calc (NIH): 113 mg/dL — ABNORMAL HIGH (ref 0–99)
Triglycerides: 189 mg/dL — ABNORMAL HIGH (ref 0–149)
VLDL Cholesterol Cal: 33 mg/dL (ref 5–40)

## 2021-12-05 LAB — HEMOGLOBIN A1C
Est. average glucose Bld gHb Est-mCnc: 148 mg/dL
Hgb A1c MFr Bld: 6.8 % — ABNORMAL HIGH (ref 4.8–5.6)

## 2021-12-23 ENCOUNTER — Other Ambulatory Visit: Payer: Self-pay

## 2021-12-23 ENCOUNTER — Encounter: Payer: Self-pay | Admitting: Family Medicine

## 2021-12-23 ENCOUNTER — Ambulatory Visit: Payer: Medicare PPO | Admitting: Family Medicine

## 2021-12-23 VITALS — BP 134/78 | HR 81 | Temp 97.7°F | Ht 63.0 in | Wt 204.4 lb

## 2021-12-23 DIAGNOSIS — E785 Hyperlipidemia, unspecified: Secondary | ICD-10-CM | POA: Diagnosis not present

## 2021-12-23 DIAGNOSIS — Z79899 Other long term (current) drug therapy: Secondary | ICD-10-CM | POA: Diagnosis not present

## 2021-12-23 DIAGNOSIS — E1169 Type 2 diabetes mellitus with other specified complication: Secondary | ICD-10-CM

## 2021-12-23 DIAGNOSIS — I10 Essential (primary) hypertension: Secondary | ICD-10-CM | POA: Diagnosis not present

## 2021-12-23 MED ORDER — ROSUVASTATIN CALCIUM 10 MG PO TABS: 10.0000 mg | ORAL_TABLET | Freq: Every day | ORAL | 1 refills | Status: DC

## 2021-12-23 NOTE — Progress Notes (Signed)
? ?Subjective:  ? ? Patient ID: Beth Mayo, female    DOB: 03/19/1951, 70 y.o.   MRN: [id number] ? ?HPI ? ?Patient here discuss labs that were done on 12/04/21. ?Results for orders placed or performed in visit on 11/22/21  ?Lipid panel  ?Result Value Ref Range  ? Cholesterol, Total 190 100 - 199 mg/dL  ? Triglycerides 189 (H) 0 - 149 mg/dL  ? HDL 44 >39 mg/dL  ? VLDL Cholesterol Cal 33 5 - 40 mg/dL  ? LDL Chol Calc (NIH) 113 (H) 0 - 99 mg/dL  ? Chol/HDL Ratio 4.3 0.0 - 4.4 ratio  ?Hepatic function panel  ?Result Value Ref Range  ? Total Protein 7.1 6.0 - 8.5 g/dL  ? Albumin 4.7 3.8 - 4.8 g/dL  ? Bilirubin Total 0.5 0.0 - 1.2 mg/dL  ? Bilirubin, Direct 0.14 0.00 - 0.40 mg/dL  ? Alkaline Phosphatase 102 44 - 121 IU/L  ? AST 19 0 - 40 IU/L  ? ALT 19 0 - 32 IU/L  ?Basic metabolic panel  ?Result Value Ref Range  ? Glucose 147 (H) 70 - 99 mg/dL  ? BUN 9 8 - 27 mg/dL  ? Creatinine, Ser 0.90 0.57 - 1.00 mg/dL  ? eGFR 70 >59 mL/min/1.73  ? BUN/Creatinine Ratio 10 (L) 12 - 28  ? Sodium 141 134 - 144 mmol/L  ? Potassium 4.2 3.5 - 5.2 mmol/L  ? Chloride 101 96 - 106 mmol/L  ? CO2 26 20 - 29 mmol/L  ? Calcium 9.8 8.7 - 10.3 mg/dL  ?Hemoglobin A1c  ?Result Value Ref Range  ? Hgb A1c MFr Bld 6.8 (H) 4.8 - 5.6 %  ? Est. average glucose Bld gHb Est-mCnc 148 mg/dL  ? ?Hyperlipidemia associated with type 2 diabetes mellitus (HCC) - Plan: Lipid Profile, Hepatic function panel, Basic Metabolic Panel (BMET), Urine Microalbumin w/creat. ratio, Hemoglobin A1C ? ?Hypertension, unspecified type - Plan: Lipid Profile, Hepatic function panel, Basic Metabolic Panel (BMET), Urine Microalbumin w/creat. ratio, Hemoglobin A1C ? ?High risk medication use - Plan: Lipid Profile, Hepatic function panel, Basic Metabolic Panel (BMET), Urine Microalbumin w/creat. ratio, Hemoglobin A1C ? ?Morbid obesity (HCC) ?We did discuss diabetes and discuss cholesterol as well as blood pressure she will work very hard over the next several months and recheck her  labs ?Review of Systems ? ?   ?Objective:  ? Physical Exam ?General-in no acute distress ?Eyes-no discharge ?Lungs-respiratory rate normal, CTA ?CV-no murmurs,RRR ?Extremities skin warm dry no edema ?Neuro grossly normal ?Behavior normal, alert ?The 10-year ASCVD risk score (Arnett DK, et al., 2019) is: 28% ?  Values used to calculate the score: ?    Age: 70 years ?    Sex: Female ?    Is Non-Hispanic African American: Yes ?    Diabetic: Yes ?    Tobacco smoker: No ?    Systolic Blood Pressure: 142 mmHg ?    Is BP treated: Yes ?    HDL Cholesterol: 44 mg/dL ?    Total Cholesterol: 190 mg/dL ? ? ? ? ?   ?Assessment & Plan:  ?Diabetes-patient with desires to work hard on diet regular exercise try to lose weight and recheck the A1c again in several months ? ?Hyperlipidemia she does agree to cholesterol medicine she will repeat lipid liver profile in 3 months with her A1c ? ?Blood pressure decent control work hard on walking healthy diet regular activity and exercise ? ?Patient does have morbid obesity she is working hard on   exercise diet and trying to bring her weight down her BMI is 36 along with diabetes and hyperlipidemia and HTN ?

## 2022-02-08 ENCOUNTER — Other Ambulatory Visit: Payer: Self-pay | Admitting: Family Medicine

## 2022-03-06 ENCOUNTER — Other Ambulatory Visit: Payer: Self-pay | Admitting: Family Medicine

## 2022-04-27 DIAGNOSIS — E1169 Type 2 diabetes mellitus with other specified complication: Secondary | ICD-10-CM | POA: Diagnosis not present

## 2022-04-27 DIAGNOSIS — E785 Hyperlipidemia, unspecified: Secondary | ICD-10-CM | POA: Diagnosis not present

## 2022-04-27 DIAGNOSIS — Z79899 Other long term (current) drug therapy: Secondary | ICD-10-CM | POA: Diagnosis not present

## 2022-04-27 DIAGNOSIS — I10 Essential (primary) hypertension: Secondary | ICD-10-CM | POA: Diagnosis not present

## 2022-04-28 ENCOUNTER — Other Ambulatory Visit: Payer: Self-pay | Admitting: *Deleted

## 2022-04-28 ENCOUNTER — Ambulatory Visit: Payer: Medicare PPO | Admitting: Family Medicine

## 2022-04-28 VITALS — BP 125/76 | HR 81 | Temp 97.6°F | Ht 63.0 in | Wt 206.8 lb

## 2022-04-28 DIAGNOSIS — E119 Type 2 diabetes mellitus without complications: Secondary | ICD-10-CM | POA: Diagnosis not present

## 2022-04-28 DIAGNOSIS — E785 Hyperlipidemia, unspecified: Secondary | ICD-10-CM

## 2022-04-28 DIAGNOSIS — I10 Essential (primary) hypertension: Secondary | ICD-10-CM

## 2022-04-28 DIAGNOSIS — E1169 Type 2 diabetes mellitus with other specified complication: Secondary | ICD-10-CM

## 2022-04-28 LAB — BASIC METABOLIC PANEL
BUN/Creatinine Ratio: 11 — ABNORMAL LOW (ref 12–28)
BUN: 11 mg/dL (ref 8–27)
CO2: 25 mmol/L (ref 20–29)
Calcium: 9.9 mg/dL (ref 8.7–10.3)
Chloride: 100 mmol/L (ref 96–106)
Creatinine, Ser: 0.99 mg/dL (ref 0.57–1.00)
Glucose: 144 mg/dL — ABNORMAL HIGH (ref 70–99)
Potassium: 4.3 mmol/L (ref 3.5–5.2)
Sodium: 143 mmol/L (ref 134–144)
eGFR: 62 mL/min/{1.73_m2} (ref >59)

## 2022-04-28 LAB — LIPID PANEL
Chol/HDL Ratio: 4.4 ratio (ref 0.0–4.4)
Cholesterol, Total: 189 mg/dL (ref 100–199)
HDL: 43 mg/dL (ref >39)
LDL Chol Calc (NIH): 108 mg/dL — ABNORMAL HIGH (ref 0–99)
Triglycerides: 217 mg/dL — ABNORMAL HIGH (ref 0–149)
VLDL Cholesterol Cal: 38 mg/dL (ref 5–40)

## 2022-04-28 LAB — HEPATIC FUNCTION PANEL
ALT: 16 IU/L (ref 0–32)
AST: 21 IU/L (ref 0–40)
Albumin: 4.6 g/dL (ref 3.9–4.9)
Alkaline Phosphatase: 110 IU/L (ref 44–121)
Bilirubin Total: 0.5 mg/dL (ref 0.0–1.2)
Bilirubin, Direct: 0.12 mg/dL (ref 0.00–0.40)
Total Protein: 7.3 g/dL (ref 6.0–8.5)

## 2022-04-28 LAB — HEMOGLOBIN A1C
Est. average glucose Bld gHb Est-mCnc: 146 mg/dL
Hgb A1c MFr Bld: 6.7 % — ABNORMAL HIGH (ref 4.8–5.6)

## 2022-04-28 LAB — MICROALBUMIN / CREATININE URINE RATIO
Creatinine, Urine: 176.3 mg/dL
Microalb/Creat Ratio: 18 mg/g creat (ref 0–29)
Microalbumin, Urine: 31 ug/mL

## 2022-04-28 MED ORDER — METFORMIN HCL 500 MG PO TABS
ORAL_TABLET | ORAL | 1 refills | Status: DC
Start: 2022-04-28 — End: 2022-10-22

## 2022-04-28 MED ORDER — ROSUVASTATIN CALCIUM 10 MG PO TABS: ORAL_TABLET | ORAL | 1 refills | Status: DC

## 2022-04-28 NOTE — Patient Instructions (Signed)

## 2022-04-28 NOTE — Progress Notes (Signed)
   Subjective:    Patient ID: EARLYNE FEESER, female    DOB: 1951-07-04, 71 y.o.   MRN: [id number]  HPI  Patient here for follow up Hyperlipidemia associated with type 2 diabetes mellitus ([place]) - Plan: Lipid Profile, Hepatic function panel, Basic Metabolic Panel  Morbid obesity ([place])  Hypertension, unspecified type  Diabetes mellitus without complication ([place])  Patient is frustrated that her weight is having a hard time coming off.  We did discuss dietary measures Currently she eats a lot of fish but mainly fried We did talk about moving toward baked broiled grilled She does a good job staying away from sugary drinks We reviewed over her recent lab work including A1c We also reviewed over her cholesterol She admits that she did not start her cholesterol medicine She was scared of side effects She was also asking about liver cleansing and herbal medicines for the liver to help lose weight Review of Systems     Objective:   Physical Exam The 10-year ASCVD risk score (Arnett DK, et al., 2019) is: 22%   Values used to calculate the score:     Age: 71 years     Sex: Female     Is Non-Hispanic African American: Yes     Diabetic: Yes     Tobacco smoker: No     Systolic Blood Pressure: 485 mmHg     Is BP treated: Yes     HDL Cholesterol: 43 mg/dL     Total Cholesterol: 189 mg/dL  General-in no acute distress Eyes-no discharge Lungs-respiratory rate normal, CTA CV-no murmurs,RRR Extremities skin warm dry no edema Neuro grossly normal Behavior normal, alert Moderate abdominal obesity no tenderness or masses      Assessment & Plan:  1. Hyperlipidemia associated with type 2 diabetes mellitus ([place]) Patient is not doing a good job taking her medicine We did discuss in detail try to put the side effects into contacts so it would not be so scary I did answer questions for her She seemed willing to try the medicine we will gradually ease into it with the goal of taking it 3  days/week Monday Wednesday Friday we will recheck lab work in approximately 10 weeks - Lipid Profile - Hepatic function panel - Basic Metabolic Panel  2. Morbid obesity ([place]) Portion control regular physical activity try to keep her weight down follow-up in 5 to 6 months  3. Hypertension, unspecified type Blood pressure good control continue current medication watch diet closely fit in walking on a regular basis  4. Diabetes mellitus without complication ([place]) Diabetes start metformin 500 mg half tablet daily for the first 1 to 2 weeks then 1 tablet daily thereafter side effects were discussed relook at A1c in approximately 6 months  We did discuss GLP-1 medications but truly I feel the patient if she make some changes to her diet regular activity and exercise can make improvements

## 2022-04-29 ENCOUNTER — Encounter: Payer: Self-pay | Admitting: Family Medicine

## 2022-04-29 NOTE — Progress Notes (Signed)
Directions for statin updated to M,W,F

## 2022-05-11 ENCOUNTER — Encounter: Payer: Self-pay | Admitting: Family Medicine

## 2022-05-12 ENCOUNTER — Other Ambulatory Visit: Payer: Self-pay | Admitting: Family Medicine

## 2022-06-07 NOTE — Telephone Encounter (Signed)
Nurses I would recommend for the patient to consider adding MiraLAX one half a capful of the powder in 8 ounces of water on a daily basis this should help with bowel movements stay more regular continue the Metamucil.  If not seeing significant improvement in the next 7 days it is important for Beth Mayo to let me know She is due for her colonoscopy in January but we may have to move this up if this issue persists as a ongoing trouble Please have her give me an update in 7 days thanks-Dr. Nicki Reaper

## 2022-06-16 ENCOUNTER — Ambulatory Visit (INDEPENDENT_AMBULATORY_CARE_PROVIDER_SITE_OTHER): Payer: Medicare PPO

## 2022-06-16 VITALS — Wt 206.0 lb

## 2022-06-16 DIAGNOSIS — Z0000 Encounter for general adult medical examination without abnormal findings: Secondary | ICD-10-CM | POA: Diagnosis not present

## 2022-06-16 NOTE — Progress Notes (Signed)
Virtual Visit via Telephone Note  I connected with  Beth Mayo on 06/16/22 at  9:30 AM EDT by telephone and verified that I am speaking with the correct person using two identifiers.  Location: Patient: home Provider: [facility] Persons participating in the virtual visit: patient/Nurse Health Advisor   I discussed the limitations, risks, security and privacy concerns of performing an evaluation and management service by telephone and the availability of in person appointments. The patient expressed understanding and agreed to proceed.  Interactive audio and video telecommunications were attempted between this nurse and patient, however failed, due to patient having technical difficulties OR patient did not have access to video capability.  We continued and completed visit with audio only.  Some vital signs may be absent or patient reported.   Dionisio David, LPN  Subjective:   Beth Mayo is a 71 y.o. female who presents for Medicare Annual (Subsequent) preventive examination.  Review of Systems     Cardiac Risk Factors include: advanced age (>59mn, >>71women);hypertension     Objective:    There were no vitals filed for this visit. There is no height or weight on file to calculate BMI.     06/16/2022    9:31 AM 04/21/2021   10:24 AM 09/27/2016    4:56 PM 12/04/2012   12:36 PM  Advanced Directives  Does Patient Have a Medical Advance Directive? No No No Patient does not have advance directive;Patient would not like information  Would patient like information on creating a medical advance directive? No - Patient declined No - Patient declined      Current Medications (verified) Outpatient Encounter Medications as of 06/16/2022  Medication Sig   Biotin 10000 MCG TABS Take by mouth daily.   cetirizine (ZYRTEC) 10 MG tablet Take 10 mg by mouth daily.   Cholecalciferol (VITAMIN D3) 3000 units TABS Take by mouth daily.   metFORMIN (GLUCOPHAGE) 500 MG tablet Take 1 tablet by mouth  once a day   Methylcobalamin (B-12) 5000 MCG TBDP Take by mouth daily.   pantoprazole (PROTONIX) 40 MG tablet TAKE 1 TABLET BY MOUTH EVERY DAY (Patient taking differently: TAKE 1 TABLET BY MOUTH EVERY DAY PRN)   rosuvastatin (CRESTOR) 10 MG tablet Take one tablet by mouth Monday, Wednesday and Friday   triamterene-hydrochlorothiazide (DYAZIDE) 37.5-25 MG capsule TAKE 1 CAPSULE BY MOUTH EVERY DAY   No facility-administered encounter medications on file as of 06/16/2022.    Allergies (verified) Ace inhibitors, Codeine, and Phenobarbital   History: Past Medical History:  Diagnosis Date   Angio-edema    Chronic kidney disease    kidney stones   GERD (gastroesophageal reflux disease)    Headache(784.0)    migraines   Hypertension    Spinal headache    Urticaria    Past Surgical History:  Procedure Laterality Date   BREAST SURGERY     breast reduction   CESAREAN SECTION     X2   COLONOSCOPY  01/18/2008     SLF: Moderate internal hemorrhoids.Otherwise no polyps, masses inflammatory changes, diverticular  AVMs.   DILATION AND CURETTAGE OF UTERUS     for miscarriage   ESOPHAGOGASTRODUODENOSCOPY N/A 12/04/2012   [id number] was found at the gastroesophageal junction/Small hiatal hernia/ MODERATE Erosive gastritis/MILD DUODENITIS in the bulb and second portion of the duodenum/NEW ONSET DYSPEPSIA DUE TO NSAID INDUCED GASTRITIS/DUODENITIS, negative path   rotator cuff right Right    TONSILLECTOMY     TUBAL LIGATION     Family  History  Problem Relation Age of Onset   Colon cancer Neg Hx    Allergic rhinitis Neg Hx    Angioedema Neg Hx    Asthma Neg Hx    Atopy Neg Hx    Eczema Neg Hx    Immunodeficiency Neg Hx    Urticaria Neg Hx    Social History   Socioeconomic History   Marital status: Married    Spouse name: Not on file   Number of children: Not on file   Years of education: Not on file   Highest education level: Not on file  Occupational History   Occupation:  retired    Comment: Education officer, museum, 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  Tobacco Use   Smoking status: Never   Smokeless tobacco: Never  Substance and Sexual Activity   Alcohol use: Yes    Comment: socially   Drug use: No   Sexual activity: Not on file  Other Topics Concern   Not on file  Social History Narrative   Not on file   Social Determinants of Health   Financial Resource Strain: Low Risk  (06/16/2022)   Overall Financial Resource Strain (CARDIA)    Difficulty of Paying Living Expenses: Not hard at all  Food Insecurity: No Food Insecurity (06/16/2022)   Hunger Vital Sign    Worried About Running Out of Food in the Last Year: Never true    [place] in the Last Year: Never true  Transportation Needs: No Transportation Needs (06/16/2022)   PRAPARE - Hydrologist (Medical): No    Lack of Transportation (Non-Medical): No  Physical Activity: Insufficiently Active (06/16/2022)   Exercise Vital Sign    Days of Exercise per Week: 2 days    Minutes of Exercise per Session: 20 min  Stress: No Stress Concern Present (06/16/2022)   [place]    Feeling of Stress : Not at all  Social Connections: Moderately Integrated (06/16/2022)   Social Connection and Isolation Panel [NHANES]    Frequency of Communication with Friends and Family: Twice a week    Frequency of Social Gatherings with Friends and Family: Once a week    Attends Religious Services: More than 4 times per year    Active Member of [employer] or Organizations: No    Attends Music therapist: Never    Marital Status: Married    Tobacco Counseling Counseling given: Not Answered   Clinical Intake:  Pre-visit preparation completed: Yes  Pain : No/denies pain     Nutritional Risks: None Diabetes: Yes CBG done?: No Did pt. bring in CBG monitor from home?: No  How often do you need to have someone help you when you read  instructions, pamphlets, or other written materials from your doctor or pharmacy?: 1 - Never  Diabetic?yes Nutrition Risk Assessment:  Has the patient had any N/V/D within the last 2 months?  No  Does the patient have any non-healing wounds?  No  Has the patient had any unintentional weight loss or weight gain?  No   Diabetes:  Is the patient diabetic?  Yes  If diabetic, was a CBG obtained today?  No  Did the patient bring in their glucometer from home?  No  How often do you monitor your CBG's? never.   Financial Strains and Diabetes Management:  Are you having any financial strains with the device, your supplies or your medication? No .  Does the  patient want to be seen by Chronic Care Management for management of their diabetes?  No  Would the patient like to be referred to a Nutritionist or for Diabetic Management?  No   Diabetic Exams:  Diabetic Eye Exam: Completed 10/22/21.  Pt has been advised about the importance in completing this exam.  Diabetic Foot Exam: Completed 11/28/18. Pt has been advised about the importance in completing this exam.   Interpreter Needed?: No  Information entered by :: Kirke Shaggy, LPN   Activities of Daily Living    06/16/2022    9:32 AM  In your present state of health, do you have any difficulty performing the following activities:  Hearing? 0  Vision? 0  Difficulty concentrating or making decisions? 0  Walking or climbing stairs? 0  Dressing or bathing? 0  Doing errands, shopping? 0  Preparing Food and eating ? N  Using the Toilet? N  In the past six months, have you accidently leaked urine? N  Do you have problems with loss of bowel control? N  Managing your Medications? N  Managing your Finances? N  Housekeeping or managing your Housekeeping? N    Patient Care Team: Kathyrn Drown, MD as PCP - General (Family Medicine) Danie Binder, MD (Inactive) as Attending Physician (Gastroenterology)  Indicate any recent Medical  Services you may have received from other than [facility] providers in the past year (date may be approximate).     Assessment:   This is a routine wellness examination for Beth Mayo.  Hearing/Vision screen Hearing Screening - Comments:: No aids Vision Screening - Comments:: Readers- Dr.Cotter  Dietary issues and exercise activities discussed: Current Exercise Habits: Home exercise routine, Type of exercise: walking, Time (Minutes): 20, Frequency (Times/Week): 2, Weekly Exercise (Minutes/Week): 40, Intensity: Mild   Goals Addressed             This Visit's Progress    DIET - EAT MORE FRUITS AND VEGETABLES         Depression Screen    06/16/2022    9:30 AM 07/29/2021   10:49 AM 04/21/2021   10:25 AM 05/09/2019   11:06 AM 01/04/2019    2:04 PM 11/17/2017    3:26 PM 01/27/2015    3:09 PM  PHQ 2/9 Scores  PHQ - 2 Score 0 0 0 0 0 0 0  PHQ- 9 Score 0     2     Fall Risk    06/16/2022    9:32 AM 07/29/2021   10:49 AM 04/21/2021   10:25 AM 04/01/2020    9:14 AM 05/09/2019   11:05 AM  Fall Risk   Falls in the past year? 0 0 0 0 0  Number falls in past yr: 0 0 0  0  Injury with Fall? 0 0 0    Risk for fall due to : No Fall Risks No Fall Risks No Fall Risks    Follow up Falls evaluation completed Falls evaluation completed Falls evaluation completed;Falls prevention discussed Falls evaluation completed     FALL RISK PREVENTION PERTAINING TO THE HOME:  Any stairs in or around the home? No  If so, are there any without handrails? No  Home free of loose throw rugs in walkways, pet beds, electrical cords, etc? Yes  Adequate lighting in your home to reduce risk of falls? Yes   ASSISTIVE DEVICES UTILIZED TO PREVENT FALLS:  Life alert? No  Use of a cane, walker or w/c? No  Grab bars in the  bathroom? Yes  Shower chair or bench in shower? Yes  Elevated toilet seat or a handicapped toilet? No    Cognitive Function:        06/16/2022    9:34 AM  6CIT Screen  What Year? 0 points  What  month? 0 points  What time? 0 points  Count back from 20 0 points  Months in reverse 0 points  Repeat phrase 0 points  Total Score 0 points    Immunizations Immunization History  Administered Date(s) Administered   Moderna Sars-Covid-2 Vaccination 12/18/2019, 01/15/2020, 09/24/2020, 02/27/2021   Pneumococcal Conjugate-13 11/28/2018   Pneumococcal Polysaccharide-23 04/01/2020    TDAP status: Due, Education has been provided regarding the importance of this vaccine. Advised may receive this vaccine at local pharmacy or Health Dept. Aware to provide a copy of the vaccination record if obtained from local pharmacy or Health Dept. Verbalized acceptance and understanding.  Flu Vaccine status: Declined, Education has been provided regarding the importance of this vaccine but patient still declined. Advised may receive this vaccine at local pharmacy or Health Dept. Aware to provide a copy of the vaccination record if obtained from local pharmacy or Health Dept. Verbalized acceptance and understanding.  Pneumococcal vaccine status: Up to date  Covid-19 vaccine status: Completed vaccines  Qualifies for Shingles Vaccine? Yes   Zostavax completed No   Shingrix Completed?: No.    Education has been provided regarding the importance of this vaccine. Patient has been advised to call insurance company to determine out of pocket expense if they have not yet received this vaccine. Advised may also receive vaccine at local pharmacy or Health Dept. Verbalized acceptance and understanding.  Screening Tests Health Maintenance  Topic Date Due   TETANUS/TDAP  Never done   Zoster Vaccines- Shingrix (1 of 2) Never done   FOOT EXAM  11/29/2019   COVID-19 Vaccine (5 - Moderna risk series) 04/24/2021   COLONOSCOPY (Pts 45-34yr Insurance coverage will need to be confirmed)  10/19/2022   OPHTHALMOLOGY EXAM  10/22/2022   MAMMOGRAM  10/28/2022   HEMOGLOBIN A1C  10/28/2022   URINE MICROALBUMIN  04/28/2023    Pneumonia Vaccine 71 Years old  Completed   DEXA SCAN  Completed   Hepatitis C Screening  Completed   HPV VACCINES  Aged Out   INFLUENZA VACCINE  Discontinued    Health Maintenance  Health Maintenance Due  Topic Date Due   TETANUS/TDAP  Never done   Zoster Vaccines- Shingrix (1 of 2) Never done   FOOT EXAM  11/29/2019   COVID-19 Vaccine (5 - Moderna risk series) 04/24/2021    Colorectal cancer screening: Type of screening: Colonoscopy. Completed 10/19/12. Repeat every 10 years  Mammogram status: Completed 10/28/21. Repeat every year  Bone Density status: Completed 12/13/18. Results reflect: Bone density results: NORMAL. Repeat every 5 years.  Lung Cancer Screening: (Low Dose CT Chest recommended if Age 69-80years, 30 pack-year currently smoking OR have quit w/in 15years.) does not qualify.    Additional Screening:  Hepatitis C Screening: does not qualify; DOES qualify- Completed 11/14/18  Vision Screening: Recommended annual ophthalmology exams for early detection of glaucoma and other disorders of the eye. Is the patient up to date with their annual eye exam?  Yes  Who is the provider or what is the name of the office in which the patient attends annual eye exams? Dr.Cotter If pt is not established with a provider, would they like to be referred to a provider to establish care? No .  Dental Screening: Recommended annual dental exams for proper oral hygiene  Community Resource Referral / Chronic Care Management: CRR required this visit?  No   CCM required this visit?  No      Plan:     I have personally reviewed and noted the following in the patient's chart:   Medical and social history Use of alcohol, tobacco or illicit drugs  Current medications and supplements including opioid prescriptions. Patient is not currently taking opioid prescriptions. Functional ability and status Nutritional status Physical activity Advanced directives List of other  physicians Hospitalizations, surgeries, and ER visits in previous 12 months Vitals Screenings to include cognitive, depression, and falls Referrals and appointments  In addition, I have reviewed and discussed with patient certain preventive protocols, quality metrics, and best practice recommendations. A written personalized care plan for preventive services as well as general preventive health recommendations were provided to patient.     Dionisio David, LPN   [id number]   Nurse Notes: none

## 2022-06-16 NOTE — Patient Instructions (Signed)
Beth Mayo , Thank you for taking time to come for your Medicare Wellness Visit. I appreciate your ongoing commitment to your health goals. Please review the following plan we discussed and let me know if I can assist you in the future.   Screening recommendations/referrals: Colonoscopy: 10/19/12 Mammogram: 10/28/21 Bone Density: 12/13/18 Recommended yearly ophthalmology/optometry visit for glaucoma screening and checkup Recommended yearly dental visit for hygiene and checkup  Vaccinations: Influenza vaccine: n/d Pneumococcal vaccine: 04/01/20 Tdap vaccine: n/d Shingles vaccine: n/d   Covid-19:12/18/19, 01/15/20, 09/24/20, 02/27/21  Advanced directives: no  Conditions/risks identified: none  Next appointment: Follow up in one year for your annual wellness visit - 06/21/23 @ 9:30 am by phone   Preventive Care 65 Years and Older, Female Preventive care refers to lifestyle choices and visits with your health care provider that can promote health and wellness. What does preventive care include? A yearly physical exam. This is also called an annual well check. Dental exams once or twice a year. Routine eye exams. Ask your health care provider how often you should have your eyes checked. Personal lifestyle choices, including: Daily care of your teeth and gums. Regular physical activity. Eating a healthy diet. Avoiding tobacco and drug use. Limiting alcohol use. Practicing safe sex. Taking low-dose aspirin every day. Taking vitamin and mineral supplements as recommended by your health care provider. What happens during an annual well check? The services and screenings done by your health care provider during your annual well check will depend on your age, overall health, lifestyle risk factors, and family history of disease. Counseling  Your health care provider may ask you questions about your: Alcohol use. Tobacco use. Drug use. Emotional well-being. Home and relationship  well-being. Sexual activity. Eating habits. History of falls. Memory and ability to understand (cognition). Work and work Statistician. Reproductive health. Screening  You may have the following tests or measurements: Height, weight, and BMI. Blood pressure. Lipid and cholesterol levels. These may be checked every 5 years, or more frequently if you are over 16 years old. Skin check. Lung cancer screening. You may have this screening every year starting at age 64 if you have a 30-pack-year history of smoking and currently smoke or have quit within the past 15 years. Fecal occult blood test (FOBT) of the stool. You may have this test every year starting at age 60. Flexible sigmoidoscopy or colonoscopy. You may have a sigmoidoscopy every 5 years or a colonoscopy every 10 years starting at age 38. Hepatitis C blood test. Hepatitis B blood test. Sexually transmitted disease (STD) testing. Diabetes screening. This is done by checking your blood sugar (glucose) after you have not eaten for a while (fasting). You may have this done every 1-3 years. Bone density scan. This is done to screen for osteoporosis. You may have this done starting at age 71. Mammogram. This may be done every 1-2 years. Talk to your health care provider about how often you should have regular mammograms. Talk with your health care provider about your test results, treatment options, and if necessary, the need for more tests. Vaccines  Your health care provider may recommend certain vaccines, such as: Influenza vaccine. This is recommended every year. Tetanus, diphtheria, and acellular pertussis (Tdap, Td) vaccine. You may need a Td booster every 10 years. Zoster vaccine. You may need this after age 20. Pneumococcal 13-valent conjugate (PCV13) vaccine. One dose is recommended after age 17. Pneumococcal polysaccharide (PPSV23) vaccine. One dose is recommended after age 24. Talk to your  health care provider about which  screenings and vaccines you need and how often you need them. This information is not intended to replace advice given to you by your health care provider. Make sure you discuss any questions you have with your health care provider. Document Released: 10/31/2015 Document Revised: 06/23/2016 Document Reviewed: 08/05/2015 Elsevier Interactive Patient Education  2017 [employer] Prevention in the Home Falls can cause injuries. They can happen to people of all ages. There are many things you can do to make your home safe and to help prevent falls. What can I do on the outside of my home? Regularly fix the edges of walkways and driveways and fix any cracks. Remove anything that might make you trip as you walk through a door, such as a raised step or threshold. Trim any bushes or trees on the path to your home. Use bright outdoor lighting. Clear any walking paths of anything that might make someone trip, such as rocks or tools. Regularly check to see if handrails are loose or broken. Make sure that both sides of any steps have handrails. Any raised decks and porches should have guardrails on the edges. Have any leaves, snow, or ice cleared regularly. Use sand or salt on walking paths during winter. Clean up any spills in your garage right away. This includes oil or grease spills. What can I do in the bathroom? Use night lights. Install grab bars by the toilet and in the tub and shower. Do not use towel bars as grab bars. Use non-skid mats or decals in the tub or shower. If you need to sit down in the shower, use a plastic, non-slip stool. Keep the floor dry. Clean up any water that spills on the floor as soon as it happens. Remove soap buildup in the tub or shower regularly. Attach bath mats securely with double-sided non-slip rug tape. Do not have throw rugs and other things on the floor that can make you trip. What can I do in the bedroom? Use night lights. Make sure that you have a  light by your bed that is easy to reach. Do not use any sheets or blankets that are too big for your bed. They should not hang down onto the floor. Have a firm chair that has side arms. You can use this for support while you get dressed. Do not have throw rugs and other things on the floor that can make you trip. What can I do in the kitchen? Clean up any spills right away. Avoid walking on wet floors. Keep items that you use a lot in easy-to-reach places. If you need to reach something above you, use a strong step stool that has a grab bar. Keep electrical cords out of the way. Do not use floor polish or wax that makes floors slippery. If you must use wax, use non-skid floor wax. Do not have throw rugs and other things on the floor that can make you trip. What can I do with my stairs? Do not leave any items on the stairs. Make sure that there are handrails on both sides of the stairs and use them. Fix handrails that are broken or loose. Make sure that handrails are as long as the stairways. Check any carpeting to make sure that it is firmly attached to the stairs. Fix any carpet that is loose or worn. Avoid having throw rugs at the top or bottom of the stairs. If you do have throw rugs, attach them  to the floor with carpet tape. Make sure that you have a light switch at the top of the stairs and the bottom of the stairs. If you do not have them, ask someone to add them for you. What else can I do to help prevent falls? Wear shoes that: Do not have high heels. Have rubber bottoms. Are comfortable and fit you well. Are closed at the toe. Do not wear sandals. If you use a stepladder: Make sure that it is fully opened. Do not climb a closed stepladder. Make sure that both sides of the stepladder are locked into place. Ask someone to hold it for you, if possible. Clearly mark and make sure that you can see: Any grab bars or handrails. First and last steps. Where the edge of each step  is. Use tools that help you move around (mobility aids) if they are needed. These include: Canes. Walkers. Scooters. Crutches. Turn on the lights when you go into a dark area. Replace any light bulbs as soon as they burn out. Set up your furniture so you have a clear path. Avoid moving your furniture around. If any of your floors are uneven, fix them. If there are any pets around you, be aware of where they are. Review your medicines with your doctor. Some medicines can make you feel dizzy. This can increase your chance of falling. Ask your doctor what other things that you can do to help prevent falls. This information is not intended to replace advice given to you by your health care provider. Make sure you discuss any questions you have with your health care provider. Document Released: 07/31/2009 Document Revised: 03/11/2016 Document Reviewed: 11/08/2014 Elsevier Interactive Patient Education  2017 [employer].

## 2022-06-23 ENCOUNTER — Other Ambulatory Visit: Payer: Self-pay | Admitting: Family Medicine

## 2022-08-07 ENCOUNTER — Other Ambulatory Visit: Payer: Self-pay | Admitting: Family Medicine

## 2022-08-11 ENCOUNTER — Encounter: Payer: Self-pay | Admitting: Nurse Practitioner

## 2022-08-11 ENCOUNTER — Ambulatory Visit: Payer: Medicare PPO | Admitting: Nurse Practitioner

## 2022-08-11 VITALS — BP 138/86 | Ht 63.0 in | Wt 208.8 lb

## 2022-08-11 DIAGNOSIS — M25561 Pain in right knee: Secondary | ICD-10-CM | POA: Diagnosis not present

## 2022-08-11 MED ORDER — IBUPROFEN 600 MG PO TABS: 600.0000 mg | ORAL_TABLET | Freq: Three times a day (TID) | ORAL | 0 refills | Status: AC | PRN

## 2022-08-11 NOTE — Progress Notes (Signed)
   Subjective:    Patient ID: Beth Mayo, female    DOB: May 03, 1951, 71 y.o.   MRN: [id number]  HPI  Patient arrives with right knee pain for one week. Patent states she had planter fascitis and was favoring the leg and she started having knee pain. The pain is on the inside and wraps around. Patient feels sharp and achy. Patient states that pain is exacerbated by bending it, however it can also hurt while laying down.   Patient denies any weakness or tingling  Review of Systems  Musculoskeletal:        Right knee pain  All other systems reviewed and are negative.      Objective:   Physical Exam Vitals reviewed.  Constitutional:      General: She is not in acute distress.    Appearance: Normal appearance. She is normal weight. She is not ill-appearing, toxic-appearing or diaphoretic.  HENT:     Head: Normocephalic and atraumatic.  Cardiovascular:     Rate and Rhythm: Normal rate and regular rhythm.     Pulses: Normal pulses.     Heart sounds: Normal heart sounds. No murmur heard. Pulmonary:     Effort: Pulmonary effort is normal. No respiratory distress.     Breath sounds: Normal breath sounds. No wheezing.  Musculoskeletal:     Right knee: No swelling, deformity, effusion, erythema, ecchymosis, lacerations, bony tenderness or crepitus. Normal range of motion. Tenderness present over the medial joint line. No lateral joint line, MCL, LCL, ACL, PCL or patellar tendon tenderness. No LCL laxity, MCL laxity, ACL laxity or PCL laxity. Normal alignment, normal meniscus and normal patellar mobility. Normal pulse.     Left knee: Normal.     Comments: Point tenderness to medial side of knee radiating towards medial thigh. No swelling or redness noted.   Skin:    General: Skin is warm.     Capillary Refill: Capillary refill takes less than 2 seconds.  Neurological:     Mental Status: She is alert.     Comments: Grossly intact  Psychiatric:        Mood and Affect: Mood normal.         Behavior: Behavior normal.           Assessment & Plan:   1. Acute pain of right knee - Suspect possible arthritis vs saphenous nerve impingement - ibuprofen (ADVIL) 600 MG tablet; Take 1 tablet (600 mg total) by mouth every 8 (eight) hours as needed.  Dispense: 30 tablet; Refill: 0 - Will trial with ibuprofen.  - If not better in 1-2 weeks will refer to ortho.

## 2022-08-19 DIAGNOSIS — M25561 Pain in right knee: Secondary | ICD-10-CM | POA: Diagnosis not present

## 2022-09-13 ENCOUNTER — Other Ambulatory Visit: Payer: Self-pay | Admitting: Family Medicine

## 2022-09-27 DIAGNOSIS — E785 Hyperlipidemia, unspecified: Secondary | ICD-10-CM | POA: Diagnosis not present

## 2022-09-27 DIAGNOSIS — E1169 Type 2 diabetes mellitus with other specified complication: Secondary | ICD-10-CM | POA: Diagnosis not present

## 2022-09-28 ENCOUNTER — Encounter: Payer: Self-pay | Admitting: Family Medicine

## 2022-09-28 ENCOUNTER — Ambulatory Visit: Payer: Medicare PPO | Admitting: Family Medicine

## 2022-09-28 VITALS — BP 138/88 | Wt 208.4 lb

## 2022-09-28 DIAGNOSIS — E119 Type 2 diabetes mellitus without complications: Secondary | ICD-10-CM | POA: Diagnosis not present

## 2022-09-28 DIAGNOSIS — I10 Essential (primary) hypertension: Secondary | ICD-10-CM

## 2022-09-28 DIAGNOSIS — E785 Hyperlipidemia, unspecified: Secondary | ICD-10-CM | POA: Diagnosis not present

## 2022-09-28 DIAGNOSIS — E1169 Type 2 diabetes mellitus with other specified complication: Secondary | ICD-10-CM | POA: Diagnosis not present

## 2022-09-28 DIAGNOSIS — M25561 Pain in right knee: Secondary | ICD-10-CM | POA: Diagnosis not present

## 2022-09-28 LAB — LIPID PANEL
Chol/HDL Ratio: 2.3 ratio (ref 0.0–4.4)
Cholesterol, Total: 100 mg/dL (ref 100–199)
HDL: 43 mg/dL (ref >39)
LDL Chol Calc (NIH): 33 mg/dL (ref 0–99)
Triglycerides: 141 mg/dL (ref 0–149)
VLDL Cholesterol Cal: 24 mg/dL (ref 5–40)

## 2022-09-28 LAB — BASIC METABOLIC PANEL
BUN/Creatinine Ratio: 13 (ref 12–28)
BUN: 12 mg/dL (ref 8–27)
CO2: 28 mmol/L (ref 20–29)
Calcium: 9.9 mg/dL (ref 8.7–10.3)
Chloride: 103 mmol/L (ref 96–106)
Creatinine, Ser: 0.92 mg/dL (ref 0.57–1.00)
Glucose: 144 mg/dL — ABNORMAL HIGH (ref 70–99)
Potassium: 4.3 mmol/L (ref 3.5–5.2)
Sodium: 143 mmol/L (ref 134–144)
eGFR: 67 mL/min/{1.73_m2} (ref >59)

## 2022-09-28 LAB — HEPATIC FUNCTION PANEL
ALT: 21 IU/L (ref 0–32)
AST: 24 IU/L (ref 0–40)
Albumin: 4.7 g/dL (ref 3.9–4.9)
Alkaline Phosphatase: 89 IU/L (ref 44–121)
Bilirubin Total: 0.7 mg/dL (ref 0.0–1.2)
Bilirubin, Direct: 0.19 mg/dL (ref 0.00–0.40)
Total Protein: 6.9 g/dL (ref 6.0–8.5)

## 2022-09-28 MED ORDER — TIRZEPATIDE 2.5 MG/0.5ML ~~LOC~~ SOAJ: 2.5000 mg | SUBCUTANEOUS | 1 refills | Status: DC

## 2022-09-28 NOTE — Progress Notes (Signed)
   Subjective:    Patient ID: Beth Mayo, female    DOB: [birth year], 71 y.o.   MRN: [id number]  HPI Pt arrives for follow up. Pt states things are getting better. No concerns at this time. Lab work completed but no A1C ordered- will call [facility] to add on A1C.  Patient for blood pressure check up.  The patient does have hypertension.   Patient relates dietary measures try to minimize salt The importance of healthy diet and activity were discussed Patient relates compliance  The patient was seen today as part of a comprehensive diabetic check up. Patient has diabetes Patient relates good compliance with taking the medication. We discussed their diet and exercise activities  We also discussed the importance of notifying us if any excessively high glucoses or low sugars.  Patient with morbid obesity difficult time losing weight Patient with right knee arthritis seen in the [facility] for this   Patient here for follow-up regarding cholesterol.    Patient relates taking medication on a regular basis Denies problems with medication Importance of dietary measures discussed Regular lab work regarding lipid and liver was checked and if needing additional labs was appropriately ordered   Review of Systems     Objective:   Physical Exam  General-in no acute distress Eyes-no discharge Lungs-respiratory rate normal, CTA CV-no murmurs,RRR Extremities skin warm dry no edema Neuro grossly normal Behavior normal, alert       Assessment & Plan:  Not quite sure why but A1c was not included but her glucose 144 so therefore not under good control We are trying to add the A1c She suffers with obesity as well unable to exercise because of right knee arthritis She is seen [facility] for her knee She is willing to try GLP-1 medicines Side effects discussed No history of pancreatitis or thyroid issues Recommend Mounjaro 2.5 once a week every 4 weeks increase the dose as  tolerated patient will give [place] feedback follow-up in 4 months labs before that visit As for her blood pressure is felt that this would be better if she is on medication Cholesterol looks great continue current medicine   HTN- patient seen for follow-up regarding HTN.   Diet, medication compliance, appropriate labs and refills were completed.   Importance of keeping blood pressure under good control to lessen the risk of complications discussed Regular follow-up visits discussed  Hyperlipidemia-importance of diet, weight control, activity, compliance with medications discussed.   Recent labs reviewed.   Any additional labs or refills ordered.   Importance of keeping under good control discussed. Regular follow-up visits discussed  The patient's BMI is calculated.  The patient does have obesity.  The patient does try to some degree staying active and watching diet.  It is in the vital signs and acknowledged.  It is above the recommended BMI for the patient's height and weight.  The patient has been counseled regarding healthy diet, restricted portions, avoiding excessive carbohydrates/sugary foods, and increase physical activity as health permits.  It is in the patient's best interest to lower the risk of secondary illness including heart disease strokes and cancer by losing weight.  The patient acknowledges this information.

## 2022-09-30 DIAGNOSIS — E119 Type 2 diabetes mellitus without complications: Secondary | ICD-10-CM | POA: Diagnosis not present

## 2022-10-01 ENCOUNTER — Other Ambulatory Visit: Payer: Self-pay | Admitting: Family Medicine

## 2022-10-01 LAB — HEMOGLOBIN A1C
Est. average glucose Bld gHb Est-mCnc: 146 mg/dL
Hgb A1c MFr Bld: 6.7 % — ABNORMAL HIGH (ref 4.8–5.6)

## 2022-10-14 DIAGNOSIS — M25561 Pain in right knee: Secondary | ICD-10-CM | POA: Diagnosis not present

## 2022-10-21 DIAGNOSIS — S83231A Complex tear of medial meniscus, current injury, right knee, initial encounter: Secondary | ICD-10-CM | POA: Diagnosis not present

## 2022-10-21 DIAGNOSIS — M25561 Pain in right knee: Secondary | ICD-10-CM | POA: Diagnosis not present

## 2022-10-22 ENCOUNTER — Other Ambulatory Visit: Payer: Self-pay | Admitting: Family Medicine

## 2022-10-29 DIAGNOSIS — Z1231 Encounter for screening mammogram for malignant neoplasm of breast: Secondary | ICD-10-CM | POA: Diagnosis not present

## 2022-11-18 ENCOUNTER — Other Ambulatory Visit: Payer: Self-pay | Admitting: Family Medicine

## 2022-11-18 DIAGNOSIS — E1169 Type 2 diabetes mellitus with other specified complication: Secondary | ICD-10-CM

## 2022-11-18 DIAGNOSIS — E119 Type 2 diabetes mellitus without complications: Secondary | ICD-10-CM

## 2022-11-18 DIAGNOSIS — Z79899 Other long term (current) drug therapy: Secondary | ICD-10-CM

## 2022-11-18 NOTE — Telephone Encounter (Signed)
Nurses-in this situation I typically like to go up on the [facility] if the patient is tolerating.  Please communicate with patient if she is tolerating the 2.5 then the next step is 5 mg once per week, 1 month supply, 3 refills  Obviously important talk with the patient if she is not tolerating the 2.5 do not go up if she is tolerating 2.5 it is best to go up on the dose to try to get a better benefit from the medicine  Do labs before follow-up visit R8V, metabolic 7, lipid, liver Diabetes hyperlipidemia

## 2022-11-19 ENCOUNTER — Other Ambulatory Visit: Payer: Self-pay | Admitting: Family Medicine

## 2022-11-19 DIAGNOSIS — E119 Type 2 diabetes mellitus without complications: Secondary | ICD-10-CM

## 2022-11-19 DIAGNOSIS — E1169 Type 2 diabetes mellitus with other specified complication: Secondary | ICD-10-CM

## 2022-11-19 NOTE — Telephone Encounter (Signed)
Lab orders placed. Mychart message sent to patient.

## 2022-12-20 ENCOUNTER — Other Ambulatory Visit: Payer: Self-pay | Admitting: Family Medicine

## 2022-12-20 DIAGNOSIS — Z1231 Encounter for screening mammogram for malignant neoplasm of breast: Secondary | ICD-10-CM

## 2022-12-28 ENCOUNTER — Telehealth: Payer: Self-pay

## 2022-12-28 ENCOUNTER — Other Ambulatory Visit: Payer: Self-pay | Admitting: Family Medicine

## 2022-12-28 NOTE — Telephone Encounter (Signed)
It is okay to take the magnesium citrate on a as needed basis I also recommend follow-up office visit late spring due to lab work before that visit It is wise for the patient to go ahead and schedule because appointments booked up early

## 2022-12-28 NOTE — Telephone Encounter (Signed)
Pt is calling wanting to know if she can take Magnesium Citrate with her cholesterol and pre diabetic meds she is currently taking.   Pt call back [phone]

## 2022-12-30 NOTE — Telephone Encounter (Signed)
Left message to return call 

## 2022-12-31 NOTE — Telephone Encounter (Signed)
Patient notified and scheduled follow up office visit 02/09/23

## 2023-01-14 ENCOUNTER — Other Ambulatory Visit: Payer: Self-pay | Admitting: Family Medicine

## 2023-01-24 ENCOUNTER — Ambulatory Visit: Payer: Medicare PPO | Admitting: Family Medicine

## 2023-01-24 ENCOUNTER — Other Ambulatory Visit: Payer: Self-pay | Admitting: Family Medicine

## 2023-01-24 ENCOUNTER — Ambulatory Visit (HOSPITAL_COMMUNITY)
Admission: RE | Admit: 2023-01-24 | Discharge: 2023-01-24 | Disposition: A | Discharge status: Home / Self Care | Payer: Medicare PPO | Source: Ambulatory Visit | Attending: Family Medicine | Admitting: Family Medicine

## 2023-01-24 VITALS — Temp 98.0°F | Ht 63.0 in | Wt 197.8 lb

## 2023-01-24 DIAGNOSIS — J189 Pneumonia, unspecified organism: Secondary | ICD-10-CM | POA: Insufficient documentation

## 2023-01-24 DIAGNOSIS — R062 Wheezing: Secondary | ICD-10-CM | POA: Diagnosis not present

## 2023-01-24 DIAGNOSIS — R059 Cough, unspecified: Secondary | ICD-10-CM | POA: Diagnosis not present

## 2023-01-24 MED ORDER — AZITHROMYCIN 250 MG PO TABS: ORAL_TABLET | ORAL | 0 refills | Status: AC

## 2023-01-24 MED ORDER — ALBUTEROL SULFATE HFA 108 (90 BASE) MCG/ACT IN AERS: 2.0000 | INHALATION_SPRAY | RESPIRATORY_TRACT | 0 refills | Status: AC | PRN

## 2023-01-24 MED ORDER — AMOXICILLIN 500 MG PO CAPS: ORAL_CAPSULE | ORAL | 0 refills | Status: DC

## 2023-01-24 NOTE — Progress Notes (Signed)
   Subjective:    Patient ID: Beth Mayo, female    DOB: April 03, 1951, 72 y.o.   MRN: [id number]  HPI  Patient arrives with cough , congestion and wheezing since Friday. Patient was significant congestion coughing to some degree wheezing no vomiting.  Energy level subpar No high fever chills or sweats Does have underlying health issues Review of Systems     Objective:   Physical Exam Bilateral rhonchi noted worse on the left than the right concerning for pneumonia expiratory wheeze noted no respiratory distress O2 saturation good HEENT benign       Assessment & Plan:  Probable pneumonia Double coverage with antibiotics per standard protocol Chest x-ray ordered If worse over the next 48 hours follow-up Follow-up sooner problems  X-ray did not show pneumonia but clinically patient does have pneumonia finish out the double antibiotics as planned Follow-up if progressive symptoms over the next 48 hours

## 2023-01-25 LAB — NOVEL CORONAVIRUS, NAA: SARS-CoV-2, NAA: NOT DETECTED

## 2023-01-25 LAB — SPECIMEN STATUS REPORT

## 2023-01-28 ENCOUNTER — Ambulatory Visit: Payer: Medicare PPO | Admitting: Family Medicine

## 2023-02-02 ENCOUNTER — Ambulatory Visit: Payer: Medicare PPO | Admitting: Family Medicine

## 2023-02-04 DIAGNOSIS — Z79899 Other long term (current) drug therapy: Secondary | ICD-10-CM | POA: Diagnosis not present

## 2023-02-04 DIAGNOSIS — E785 Hyperlipidemia, unspecified: Secondary | ICD-10-CM | POA: Diagnosis not present

## 2023-02-04 DIAGNOSIS — E1169 Type 2 diabetes mellitus with other specified complication: Secondary | ICD-10-CM | POA: Diagnosis not present

## 2023-02-04 DIAGNOSIS — E119 Type 2 diabetes mellitus without complications: Secondary | ICD-10-CM | POA: Diagnosis not present

## 2023-02-05 LAB — LIPID PANEL
Chol/HDL Ratio: 2.2 ratio (ref 0.0–4.4)
Cholesterol, Total: 94 mg/dL — ABNORMAL LOW (ref 100–199)
HDL: 43 mg/dL (ref >39)
LDL Chol Calc (NIH): 32 mg/dL (ref 0–99)
Triglycerides: 101 mg/dL (ref 0–149)
VLDL Cholesterol Cal: 19 mg/dL (ref 5–40)

## 2023-02-05 LAB — HEPATIC FUNCTION PANEL
ALT: 23 IU/L (ref 0–32)
AST: 31 IU/L (ref 0–40)
Albumin: 4.6 g/dL (ref 3.9–4.9)
Alkaline Phosphatase: 85 IU/L (ref 44–121)
Bilirubin Total: 0.6 mg/dL (ref 0.0–1.2)
Bilirubin, Direct: 0.21 mg/dL (ref 0.00–0.40)
Total Protein: 7.3 g/dL (ref 6.0–8.5)

## 2023-02-05 LAB — HEMOGLOBIN A1C
Est. average glucose Bld gHb Est-mCnc: 131 mg/dL
Hgb A1c MFr Bld: 6.2 % — ABNORMAL HIGH (ref 4.8–5.6)

## 2023-02-05 LAB — BASIC METABOLIC PANEL
BUN/Creatinine Ratio: 12 (ref 12–28)
BUN: 12 mg/dL (ref 8–27)
CO2: 27 mmol/L (ref 20–29)
Calcium: 9.8 mg/dL (ref 8.7–10.3)
Chloride: 101 mmol/L (ref 96–106)
Creatinine, Ser: 0.97 mg/dL (ref 0.57–1.00)
Glucose: 153 mg/dL — ABNORMAL HIGH (ref 70–99)
Potassium: 3.9 mmol/L (ref 3.5–5.2)
Sodium: 144 mmol/L (ref 134–144)
eGFR: 63 mL/min/{1.73_m2} (ref >59)

## 2023-02-09 ENCOUNTER — Ambulatory Visit: Payer: Medicare PPO | Admitting: Family Medicine

## 2023-02-09 ENCOUNTER — Telehealth: Payer: Self-pay

## 2023-02-09 ENCOUNTER — Telehealth: Payer: Self-pay | Admitting: Family Medicine

## 2023-02-09 VITALS — BP 124/82 | HR 83 | Ht 63.0 in | Wt 196.0 lb

## 2023-02-09 DIAGNOSIS — E119 Type 2 diabetes mellitus without complications: Secondary | ICD-10-CM

## 2023-02-09 DIAGNOSIS — I10 Essential (primary) hypertension: Secondary | ICD-10-CM | POA: Diagnosis not present

## 2023-02-09 DIAGNOSIS — R052 Subacute cough: Secondary | ICD-10-CM | POA: Diagnosis not present

## 2023-02-09 DIAGNOSIS — E785 Hyperlipidemia, unspecified: Secondary | ICD-10-CM | POA: Diagnosis not present

## 2023-02-09 DIAGNOSIS — E1169 Type 2 diabetes mellitus with other specified complication: Secondary | ICD-10-CM

## 2023-02-09 MED ORDER — TIRZEPATIDE 7.5 MG/0.5ML ~~LOC~~ SOAJ: 7.5000 mg | SUBCUTANEOUS | 1 refills | Status: DC

## 2023-02-09 NOTE — Progress Notes (Signed)
   Subjective:    Patient ID: Beth Mayo, female    DOB: [birth year], 72 y.o.   MRN: [id number]  HPI Patient arrives today for 4 month follow up to go over lab work. Patient states she still has cough. The patient was seen today as part of a comprehensive diabetic check up. Patient has diabetes Patient relates good compliance with taking the medication. We discussed their diet and exercise activities  We also discussed the importance of notifying us if any excessively high glucoses or low sugars.  Patient here for follow-up regarding cholesterol.    Patient relates taking medication on a regular basis Denies problems with medication Importance of dietary measures discussed Regular lab work regarding lipid and liver was checked and if needing additional labs was appropriately ordered  Patient for blood pressure check up.  The patient does have hypertension.   Patient relates dietary measures try to minimize salt The importance of healthy diet and activity were discussed Patient relates compliance     Review of Systems     Objective:   Physical Exam General-in no acute distress Eyes-no discharge Lungs-respiratory rate normal, CTA CV-no murmurs,RRR Extremities skin warm dry no edema Neuro grossly normal Behavior normal, alert  Diabetic foot exam normal  A1c much better at 6.2     Assessment & Plan:  1. Diabetes mellitus without complication Improvement tolerating medicine continue medication go up on the dose as tolerated and as available due to manufacturing shortages Weight has come down As long as patient tolerates dosing will keep going up every 4 weeks as long as there is medication available Lab work looks improved  2. Hyperlipidemia associated with type 2 diabetes mellitus Cholesterol coming down continue current measures  3. Hypertension, unspecified type Blood pressure good continue current measures Energy level doing okay Watching diet  4. Subacute  cough Related to recent infection should gradually resolve over the next 4 weeks if not let us know, she denies any wheezing or shortness of breath  Follow-up by fall time

## 2023-02-09 NOTE — Telephone Encounter (Signed)
Already documented.

## 2023-02-09 NOTE — Telephone Encounter (Signed)
Patient was seen this morning and was told to call back if [facility] had medication Mounjaro 7.5 if prescription is sent right over. [facility]

## 2023-03-23 DIAGNOSIS — Z7984 Long term (current) use of oral hypoglycemic drugs: Secondary | ICD-10-CM | POA: Diagnosis not present

## 2023-03-23 DIAGNOSIS — Z811 Family history of alcohol abuse and dependence: Secondary | ICD-10-CM | POA: Diagnosis not present

## 2023-03-23 DIAGNOSIS — E785 Hyperlipidemia, unspecified: Secondary | ICD-10-CM | POA: Diagnosis not present

## 2023-03-23 DIAGNOSIS — I10 Essential (primary) hypertension: Secondary | ICD-10-CM | POA: Diagnosis not present

## 2023-03-23 DIAGNOSIS — Z880 Allergy status to penicillin: Secondary | ICD-10-CM | POA: Diagnosis not present

## 2023-03-23 DIAGNOSIS — Z823 Family history of stroke: Secondary | ICD-10-CM | POA: Diagnosis not present

## 2023-03-23 DIAGNOSIS — E669 Obesity, unspecified: Secondary | ICD-10-CM | POA: Diagnosis not present

## 2023-03-23 DIAGNOSIS — Z8249 Family history of ischemic heart disease and other diseases of the circulatory system: Secondary | ICD-10-CM | POA: Diagnosis not present

## 2023-03-23 DIAGNOSIS — E119 Type 2 diabetes mellitus without complications: Secondary | ICD-10-CM | POA: Diagnosis not present

## 2023-04-04 ENCOUNTER — Other Ambulatory Visit: Payer: Self-pay | Admitting: Family Medicine

## 2023-04-05 ENCOUNTER — Encounter: Payer: Self-pay | Admitting: *Deleted

## 2023-04-05 DIAGNOSIS — E119 Type 2 diabetes mellitus without complications: Secondary | ICD-10-CM | POA: Insufficient documentation

## 2023-04-06 DIAGNOSIS — L02415 Cutaneous abscess of right lower limb: Secondary | ICD-10-CM | POA: Diagnosis not present

## 2023-04-06 DIAGNOSIS — R895 Abnormal microbiological findings in specimens from other organs, systems and tissues: Secondary | ICD-10-CM | POA: Diagnosis not present

## 2023-04-13 DIAGNOSIS — R895 Abnormal microbiological findings in specimens from other organs, systems and tissues: Secondary | ICD-10-CM | POA: Diagnosis not present

## 2023-05-16 ENCOUNTER — Encounter: Payer: Self-pay | Admitting: Family Medicine

## 2023-05-16 DIAGNOSIS — I10 Essential (primary) hypertension: Secondary | ICD-10-CM

## 2023-05-16 DIAGNOSIS — E119 Type 2 diabetes mellitus without complications: Secondary | ICD-10-CM

## 2023-05-16 DIAGNOSIS — E1169 Type 2 diabetes mellitus with other specified complication: Secondary | ICD-10-CM

## 2023-05-17 MED ORDER — TIRZEPATIDE 10 MG/0.5ML ~~LOC~~ SOAJ: 10.0000 mg | SUBCUTANEOUS | 3 refills | Status: DC

## 2023-05-17 NOTE — Telephone Encounter (Signed)
Nurses May go up on Mounjaro 10 mg once weekly 1 month supply with 3 refills  If any difficulties or problems patient to notify us  Also before patient has her office visit in October she should do A1c, lipid, liver, metabolic 7, urine ACR Diagnosis diabetes, hyperlipidemia, hypertension

## 2023-06-24 ENCOUNTER — Ambulatory Visit (INDEPENDENT_AMBULATORY_CARE_PROVIDER_SITE_OTHER): Payer: Medicare PPO

## 2023-06-24 VITALS — Ht 64.0 in | Wt 187.0 lb

## 2023-06-24 DIAGNOSIS — Z0000 Encounter for general adult medical examination without abnormal findings: Secondary | ICD-10-CM

## 2023-06-24 NOTE — Progress Notes (Signed)
 Because this visit was a virtual/telehealth visit,  certain criteria was not obtained, such a blood pressure, CBG if patient is a diabetic, and timed get up and go. Any medications not marked as "taking" was not mentioned during the medication reconciliation part of the visit. Any vitals not documented were not able to be obtained due to this being a telehealth visit. Vitals that have been documented are verbally provided by the patient.  Patient was unable to self-report a recent blood pressure reading due to a lack of equipment at home via telehealth.  Subjective:   Beth Mayo is a 72 y.o. female who presents for Medicare Annual (Subsequent) preventive examination.  Visit Complete: Virtual  I connected with  Beth Mayo on 06/24/23 by a audio enabled telemedicine application and verified that I am speaking with the correct person using two identifiers.  Patient Location: Home  Provider Location: Home Office  I discussed the limitations of evaluation and management by telemedicine. The patient expressed understanding and agreed to proceed.  Patient Medicare AWV questionnaire was completed by the patient on na; I have confirmed that all information answered by patient is correct and no changes since this date.  Review of Systems     Cardiac Risk Factors include: advanced age (>20men, >54 women);diabetes mellitus;dyslipidemia;hypertension;obesity (BMI >30kg/m2);sedentary lifestyle     Objective:    Today's Vitals   06/24/23 0858  Weight: 187 lb (84.8 kg)  Height: 5\' 4"  (1.626 m)   Body mass index is 32.1 kg/m.     06/24/2023    8:57 AM 06/16/2022    9:31 AM 04/21/2021   10:24 AM 09/27/2016    4:56 PM 12/04/2012   12:36 PM  Advanced Directives  Does Patient Have a Medical Advance Directive? No No No No Patient does not have advance directive;Patient would not like information  Would patient like information on creating a medical advance directive? No - Patient declined No -  Patient declined No - Patient declined      Current Medications (verified) Outpatient Encounter Medications as of 06/24/2023  Medication Sig   metFORMIN (GLUCOPHAGE) 500 MG tablet TAKE 1 TABLET BY MOUTH EVERY DAY   rosuvastatin (CRESTOR) 10 MG tablet TAKE 1 TABLET BY MOUTH EVERY DAY   tirzepatide (MOUNJARO) 10 MG/0.5ML Pen Inject 10 mg into the skin once a week.   triamterene-hydrochlorothiazide (DYAZIDE) 37.5-25 MG capsule TAKE 1 CAPSULE BY MOUTH EVERY DAY   albuterol (VENTOLIN HFA) 108 (90 Base) MCG/ACT inhaler Inhale 2 puffs into the lungs every 4 (four) hours as needed for wheezing or shortness of breath.   Biotin 56213 MCG TABS Take by mouth daily.   cetirizine (ZYRTEC) 10 MG tablet Take 10 mg by mouth daily.   Cholecalciferol (VITAMIN D3) 3000 units TABS Take by mouth daily.   ibuprofen (ADVIL) 600 MG tablet Take 1 tablet (600 mg total) by mouth every 8 (eight) hours as needed.   Methylcobalamin (B-12) 5000 MCG TBDP Take by mouth daily.   pantoprazole (PROTONIX) 40 MG tablet TAKE 1 TABLET BY MOUTH EVERY DAY (Patient not taking: Reported on 06/24/2023)   No facility-administered encounter medications on file as of 06/24/2023.    Allergies (verified) Ace inhibitors, Codeine, and Phenobarbital   History: Past Medical History:  Diagnosis Date   Angio-edema    Chronic kidney disease    kidney stones   GERD (gastroesophageal reflux disease)    Headache(784.0)    migraines   Hypertension    Spinal headache  Urticaria    Past Surgical History:  Procedure Laterality Date   BREAST SURGERY     breast reduction   CESAREAN SECTION     X2   COLONOSCOPY  01/18/2008     SLF: Moderate internal hemorrhoids.Otherwise no polyps, masses inflammatory changes, diverticular  AVMs.   DILATION AND CURETTAGE OF UTERUS     for miscarriage   ESOPHAGOGASTRODUODENOSCOPY N/A 12/04/2012   [id number] ring was found at the gastroesophageal junction/Small hiatal hernia/ MODERATE Erosive gastritis/MILD  DUODENITIS in the bulb and second portion of the duodenum/NEW ONSET DYSPEPSIA DUE TO NSAID INDUCED GASTRITIS/DUODENITIS, negative path   rotator cuff right Right    TONSILLECTOMY     TUBAL LIGATION     Family History  Problem Relation Age of Onset   Colon cancer Neg Hx    Allergic rhinitis Neg Hx    Angioedema Neg Hx    Asthma Neg Hx    Atopy Neg Hx    Eczema Neg Hx    Immunodeficiency Neg Hx    Urticaria Neg Hx    Social History   Socioeconomic History   Marital status: Married    Spouse name: Not on file   Number of children: Not on file   Years of education: Not on file   Highest education level: Master's degree (e.g., MA, MS, MEng, MEd, MSW, MBA)  Occupational History   Occupation: retired    Comment: Engineer, site, 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  Tobacco Use   Smoking status: Never   Smokeless tobacco: Never  Substance and Sexual Activity   Alcohol use: Yes    Comment: socially   Drug use: No   Sexual activity: Not on file  Other Topics Concern   Not on file  Social History Narrative   Not on file   Social Determinants of Health   Financial Resource Strain: Low Risk  (06/24/2023)   Overall Financial Resource Strain (CARDIA)    Difficulty of Paying Living Expenses: Not hard at all  Food Insecurity: No Food Insecurity (06/24/2023)   Hunger Vital Sign    Worried About Running Out of Food in the Last Year: Never true    Ran Out of Food in the Last Year: Never true  Transportation Needs: No Transportation Needs (06/24/2023)   PRAPARE - Administrator, Civil Service (Medical): No    Lack of Transportation (Non-Medical): No  Physical Activity: Patient Declined (06/24/2023)   Exercise Vital Sign    Days of Exercise per Week: Patient declined    Minutes of Exercise per Session: Patient declined  Stress: No Stress Concern Present (06/24/2023)   [employer] of Occupational Health - Occupational Stress Questionnaire    Feeling of Stress : Not at all  Social Connections:  Moderately Integrated (06/24/2023)   Social Connection and Isolation Panel [NHANES]    Frequency of Communication with Friends and Family: More than three times a week    Frequency of Social Gatherings with Friends and Family: More than three times a week    Attends Religious Services: More than 4 times per year    Active Member of [employer] or Organizations: No    Attends Engineer, structural: Never    Marital Status: Married    Tobacco Counseling Counseling given: Yes   Clinical Intake:  Pre-visit preparation completed: Yes  Pain : No/denies pain     BMI - recorded: 32.1 Nutritional Status: BMI > 30  Obese Nutritional Risks: None Diabetes: Yes CBG done?: No (telehealth visit.  unable to obtain cbg) Did pt. bring in CBG monitor from home?: No  How often do you need to have someone help you when you read instructions, pamphlets, or other written materials from your doctor or pharmacy?: 1 - Never  Interpreter Needed?: No  Information entered by ::  Aizik Reh, CMA   Activities of Daily Living    06/24/2023    9:10 AM  In your present state of health, do you have any difficulty performing the following activities:  Hearing? 0  Vision? 0  Difficulty concentrating or making decisions? 0  Walking or climbing stairs? 0  Dressing or bathing? 0  Doing errands, shopping? 0  Preparing Food and eating ? N  Using the Toilet? N  In the past six months, have you accidently leaked urine? N  Do you have problems with loss of bowel control? N  Managing your Medications? N  Managing your Finances? N  Housekeeping or managing your Housekeeping? N    Patient Care Team: Babs Sciara, MD as PCP - General (Family Medicine) West Bali, MD (Inactive) as Attending Physician (Gastroenterology)  Indicate any recent Medical Services you may have received from other than [facility] providers in the past year (date may be approximate).     Assessment:   This is a routine wellness  examination for Demond.  Hearing/Vision screen Hearing Screening - Comments:: Patient denies any hearing difficulties. Vision Screening - Comments:: Wears rx glasses - up to date with routine eye exams with Dr. Daisy Lazar w/ [facility]    Goals Addressed             This Visit's Progress    Patient Stated       Continue to lose weight and increase activity       Depression Screen    06/24/2023    9:02 AM 02/09/2023    9:25 AM 09/28/2022    8:47 AM 08/11/2022    2:37 PM 06/16/2022    9:30 AM 07/29/2021   10:49 AM 04/21/2021   10:25 AM  PHQ 2/9 Scores  PHQ - 2 Score 0 0 0 0 0 0 0  PHQ- 9 Score  0   0      Fall Risk    06/24/2023    9:10 AM 02/09/2023    9:24 AM 09/28/2022    8:47 AM 08/11/2022    2:37 PM 06/16/2022    9:32 AM  Fall Risk   Falls in the past year? 0 0 0 0 0  Number falls in past yr: 0 0 0  0  Injury with Fall? 0 0 0  0  Risk for fall due to : No Fall Risks  No Fall Risks No Fall Risks No Fall Risks  Follow up Falls prevention discussed  Falls evaluation completed Falls evaluation completed Falls evaluation completed    MEDICARE RISK AT HOME: Medicare Risk at Home Any stairs in or around the home?: Yes If so, are there any without handrails?: No Home free of loose throw rugs in walkways, pet beds, electrical cords, etc?: Yes Adequate lighting in your home to reduce risk of falls?: Yes Life alert?: No Use of a cane, walker or w/c?: No Grab bars in the bathroom?: Yes Shower chair or bench in shower?: Yes Elevated toilet seat or a handicapped toilet?: Yes  TIMED UP AND GO:  Was the test performed?  No    Cognitive Function:        06/24/2023  9:01 AM 06/16/2022    9:34 AM  6CIT Screen  What Year? 0 points 0 points  What month? 0 points 0 points  What time? 0 points 0 points  Count back from 20 0 points 0 points  Months in reverse 0 points 0 points  Repeat phrase 0 points 0 points  Total Score 0 points 0 points     Immunizations Immunization History  Administered Date(s) Administered   Moderna Sars-Covid-2 Vaccination 12/18/2019, 01/15/2020, 09/24/2020, 02/27/2021   Pneumococcal Conjugate-13 11/28/2018   Pneumococcal Polysaccharide-23 04/01/2020   Unspecified SARS-COV-2 Vaccination 07/07/2022   Zoster Recombinant(Shingrix) 11/25/2022    TDAP status: Due, Education has been provided regarding the importance of this vaccine. Advised may receive this vaccine at local pharmacy or Health Dept. Aware to provide a copy of the vaccination record if obtained from local pharmacy or Health Dept. Verbalized acceptance and understanding.  Flu Vaccine status: Due, Education has been provided regarding the importance of this vaccine. Advised may receive this vaccine at local pharmacy or Health Dept. Aware to provide a copy of the vaccination record if obtained from local pharmacy or Health Dept. Verbalized acceptance and understanding.  Pneumococcal vaccine status: Up to date  Covid-19 vaccine status: Information provided on how to obtain vaccines.   Qualifies for Shingles Vaccine? Yes   Zostavax completed No   Shingrix Completed?: No.    Education has been provided regarding the importance of this vaccine. Patient has been advised to call insurance company to determine out of pocket expense if they have not yet received this vaccine. Advised may also receive vaccine at local pharmacy or Health Dept. Verbalized acceptance and understanding.  Screening Tests Health Maintenance  Topic Date Due   DTaP/Tdap/Td (1 - Tdap) Never done   Colonoscopy  10/19/2022   OPHTHALMOLOGY EXAM  10/22/2022   MAMMOGRAM  10/28/2022   Zoster Vaccines- Shingrix (2 of 2) 01/20/2023   Diabetic kidney evaluation - Urine ACR  04/28/2023   INFLUENZA VACCINE  Never done   Medicare Annual Wellness (AWV)  06/17/2023   COVID-19 Vaccine (6 - 2023-24 season) 06/19/2023   HEMOGLOBIN A1C  08/06/2023   Diabetic kidney evaluation - eGFR  measurement  02/04/2024   FOOT EXAM  02/09/2024   Pneumonia Vaccine 35+ Years old  Completed   DEXA SCAN  Completed   Hepatitis C Screening  Completed   HPV VACCINES  Aged Out    Health Maintenance  Health Maintenance Due  Topic Date Due   DTaP/Tdap/Td (1 - Tdap) Never done   Colonoscopy  10/19/2022   OPHTHALMOLOGY EXAM  10/22/2022   MAMMOGRAM  10/28/2022   Zoster Vaccines- Shingrix (2 of 2) 01/20/2023   Diabetic kidney evaluation - Urine ACR  04/28/2023   INFLUENZA VACCINE  Never done   Medicare Annual Wellness (AWV)  06/17/2023   COVID-19 Vaccine (6 - 2023-24 season) 06/19/2023    Colorectal Cancer Screening: Patient declined referral to GI for a colonoscopy     Mammogram status: Completed 12/20/2022. Repeat every year  Bone Density status: Completed 12/13/2018. Results reflect: Bone density results: NORMAL. Repeat every 5 years.  Lung Cancer Screening: (Low Dose CT Chest recommended if Age 29-80 years, 20 pack-year currently smoking OR have quit w/in 15years.) does not qualify.    Additional Screening:  Hepatitis C Screening: does not qualify; Completed 11/14/2018  Vision Screening: Recommended annual ophthalmology exams for early detection of glaucoma and other disorders of the eye. Is the patient up to date with their annual eye  exam?  Yes  Who is the provider or what is the name of the office in which the patient attends annual eye exams? [facility] If pt is not established with a provider, would they like to be referred to a provider to establish care? No .   Dental Screening: Recommended annual dental exams for proper oral hygiene  Diabetic Foot Exam: Diabetic Foot Exam: Completed 02/09/2023  Community Resource Referral / Chronic Care Management: CRR required this visit?  No   CCM required this visit?  No     Plan:     I have personally reviewed and noted the following in the patient's chart:   Medical and social history Use of alcohol, tobacco or  illicit drugs  Current medications and supplements including opioid prescriptions. Patient is not currently taking opioid prescriptions. Functional ability and status Nutritional status Physical activity Advanced directives List of other physicians Hospitalizations, surgeries, and ER visits in previous 12 months Vitals Screenings to include cognitive, depression, and falls Referrals and appointments  In addition, I have reviewed and discussed with patient certain preventive protocols, quality metrics, and best practice recommendations. A written personalized care plan for preventive services as well as general preventive health recommendations were provided to patient.     Jordan Hawks Jakwon Gayton, CMA   06/24/2023   After Visit Summary: (MyChart) Due to this being a telephonic visit, the after visit summary with patients personalized plan was offered to patient via MyChart   Nurse Notes:

## 2023-06-24 NOTE — Patient Instructions (Signed)
Beth Mayo , Thank you for taking time to come for your Medicare Wellness Visit. I appreciate your ongoing commitment to your health goals. Please review the following plan we discussed and let me know if I can assist you in the future.   Referrals/Orders/Follow-Ups/Clinician Recommendations:  You are due for a colonoscopy. If you change your mind about having this screening performed, please call the office or send [place] a message via mychart so we can put in a referral.    This is a list of the screening recommended for you and due dates:  Health Maintenance  Topic Date Due   DTaP/Tdap/Td vaccine (1 - Tdap) Never done   Colon Cancer Screening  10/19/2022   Eye exam for diabetics  10/22/2022   Mammogram  10/28/2022   Zoster (Shingles) Vaccine (2 of 2) 01/20/2023   Yearly kidney health urinalysis for diabetes  04/28/2023   Flu Shot  Never done   COVID-19 Vaccine (6 - 2023-24 season) 06/19/2023   Hemoglobin A1C  08/06/2023   DEXA scan (bone density measurement)  12/14/2023   Yearly kidney function blood test for diabetes  02/04/2024   Complete foot exam   02/09/2024   Medicare Annual Wellness Visit  06/23/2024   Pneumonia Vaccine  Completed   Hepatitis C Screening  Completed   HPV Vaccine  Aged Out    Advanced directives: (ACP Link)Information on Advanced Care Planning can be found at [facility] [place] Advance Health Care Directives Advance Health Care Directives ([email or web address]   Next Medicare Annual Wellness Visit scheduled for next year: Yes  Preventive Care 65 Years and Older, Female Preventive care refers to lifestyle choices and visits with your health care provider that can promote health and wellness. Preventive care visits are also called wellness exams. What can I expect for my preventive care visit? Counseling Your health care provider may ask you questions about your: Medical history, including: Past medical problems. Family medical history. Pregnancy and  menstrual history. History of falls. Current health, including: Memory and ability to understand (cognition). Emotional well-being. Home life and relationship well-being. Sexual activity and sexual health. Lifestyle, including: Alcohol, nicotine or tobacco, and drug use. Access to firearms. Diet, exercise, and sleep habits. Work and work Astronomer. Sunscreen use. Safety issues such as seatbelt and bike helmet use. Physical exam Your health care provider will check your: Height and weight. These may be used to calculate your BMI (body mass index). BMI is a measurement that tells if you are at a healthy weight. Waist circumference. This measures the distance around your waistline. This measurement also tells if you are at a healthy weight and may help predict your risk of certain diseases, such as type 2 diabetes and high blood pressure. Heart rate and blood pressure. Body temperature. Skin for abnormal spots. What immunizations do I need?  Vaccines are usually given at various ages, according to a schedule. Your health care provider will recommend vaccines for you based on your age, medical history, and lifestyle or other factors, such as travel or where you work. What tests do I need? Screening Your health care provider may recommend screening tests for certain conditions. This may include: Lipid and cholesterol levels. Hepatitis C test. Hepatitis B test. HIV (human immunodeficiency virus) test. STI (sexually transmitted infection) testing, if you are at risk. Lung cancer screening. Colorectal cancer screening. Diabetes screening. This is done by checking your blood sugar (glucose) after you have not eaten for a while (fasting). Mammogram. Talk with  your health care provider about how often you should have regular mammograms. BRCA-related cancer screening. This may be done if you have a family history of breast, ovarian, tubal, or peritoneal cancers. Bone density scan. This is  done to screen for osteoporosis. Talk with your health care provider about your test results, treatment options, and if necessary, the need for more tests. Follow these instructions at home: Eating and drinking  Eat a diet that includes fresh fruits and vegetables, whole grains, lean protein, and low-fat dairy products. Limit your intake of foods with high amounts of sugar, saturated fats, and salt. Take vitamin and mineral supplements as recommended by your health care provider. Do not drink alcohol if your health care provider tells you not to drink. If you drink alcohol: Limit how much you have to 0-1 drink a day. Know how much alcohol is in your drink. In the [place] one drink equals one 12 oz bottle of beer (355 mL), one 5 oz glass of wine (148 mL), or one 1 oz glass of hard liquor (44 mL). Lifestyle Brush your teeth every morning and night with fluoride toothpaste. Floss one time each day. Exercise for at least 30 minutes 5 or more days each week. Do not use any products that contain nicotine or tobacco. These products include cigarettes, chewing tobacco, and vaping devices, such as e-cigarettes. If you need help quitting, ask your health care provider. Do not use drugs. If you are sexually active, practice safe sex. Use a condom or other form of protection in order to prevent STIs. Take aspirin only as told by your health care provider. Make sure that you understand how much to take and what form to take. Work with your health care provider to find out whether it is safe and beneficial for you to take aspirin daily. Ask your health care provider if you need to take a cholesterol-lowering medicine (statin). Find healthy ways to manage stress, such as: Meditation, yoga, or listening to music. Journaling. Talking to a trusted person. Spending time with friends and family. Minimize exposure to UV radiation to reduce your risk of skin cancer. Safety Always wear your seat belt while driving  or riding in a vehicle. Do not drive: If you have been drinking alcohol. Do not ride with someone who has been drinking. When you are tired or distracted. While texting. If you have been using any mind-altering substances or drugs. Wear a helmet and other protective equipment during sports activities. If you have firearms in your house, make sure you follow all gun safety procedures. What's next? Visit your health care provider once a year for an annual wellness visit. Ask your health care provider how often you should have your eyes and teeth checked. Stay up to date on all vaccines. This information is not intended to replace advice given to you by your health care provider. Make sure you discuss any questions you have with your health care provider. Document Revised: 04/01/2021 Document Reviewed: 04/01/2021 Elsevier Patient Education  2024 [employer]. Understanding Your Risk for Falls Millions of people have serious injuries from falls each year. It is important to understand your risk of falling. Talk with your health care provider about your risk and what you can do to lower it. If you do have a serious fall, make sure to tell your provider. Falling once raises your risk of falling again. How can falls affect me? Serious injuries from falls are common. These include: Broken bones, such as hip fractures.  Head injuries, such as traumatic brain injuries (TBI) or concussions. A fear of falling can cause you to avoid activities and stay at home. This can make your muscles weaker and raise your risk for a fall. What can increase my risk? There are a number of risk factors that increase your risk for falling. The more risk factors you have, the higher your risk of falling. Serious injuries from a fall happen most often to people who are older than 70 years old. Teenagers and young adults ages 19-29 are also at higher risk. Common risk factors include: Weakness in the lower body. Being  generally weak or confused due to long-term (chronic) illness. Dizziness or balance problems. Poor vision. Medicines that cause dizziness or drowsiness. These may include: Medicines for your blood pressure, heart, anxiety, insomnia, or swelling (edema). Pain medicines. Muscle relaxants. Other risk factors include: Drinking alcohol. Having had a fall in the past. Having foot pain or wearing improper footwear. Working at a dangerous job. Having any of the following in your home: Tripping hazards, such as floor clutter or loose rugs. Poor lighting. Pets. Having dementia or memory loss. What actions can I take to lower my risk of falling?     Physical activity Stay physically fit. Do strength and balance exercises. Consider taking a regular class to build strength and balance. Yoga and tai chi are good options. Vision Have your eyes checked every year and your prescription for glasses or contacts updated as needed. Shoes and walking aids Wear non-skid shoes. Wear shoes that have rubber soles and low heels. Do not wear high heels. Do not walk around the house in socks or slippers. Use a cane or walker as told by your provider. Home safety Attach secure railings on both sides of your stairs. Install grab bars for your bathtub, shower, and toilet. Use a non-skid mat in your bathtub or shower. Attach bath mats securely with double-sided, non-slip rug tape. Use good lighting in all rooms. Keep a flashlight near your bed. Make sure there is a clear path from your bed to the bathroom. Use night-lights. Do not use throw rugs. Make sure all carpeting is taped or tacked down securely. Remove all clutter from walkways and stairways, including extension cords. Repair uneven or broken steps and floors. Avoid walking on icy or slippery surfaces. Walk on the grass instead of on icy or slick sidewalks. Use ice melter to get rid of ice on walkways in the winter. Use a cordless phone. Questions to  ask your health care provider Can you help me check my risk for a fall? Do any of my medicines make me more likely to fall? Should I take a vitamin D supplement? What exercises can I do to improve my strength and balance? Should I make an appointment to have my vision checked? Do I need a bone density test to check for weak bones (osteoporosis)? Would it help to use a cane or a walker? Where to find more information Centers for Disease Control and Prevention, STEADI: TonerPromos.no Community-Based Fall Prevention Programs: TonerPromos.no [employer] on Aging: BaseRingTones.pl Contact a health care provider if: You fall at home. You are afraid of falling at home. You feel weak, drowsy, or dizzy. This information is not intended to replace advice given to you by your health care provider. Make sure you discuss any questions you have with your health care provider. Document Revised: 06/07/2022 Document Reviewed: 06/07/2022 Elsevier Patient Education  2024 [employer].

## 2023-06-28 ENCOUNTER — Telehealth: Payer: Self-pay | Admitting: Family Medicine

## 2023-06-28 NOTE — Telephone Encounter (Signed)
Nurses Please see form We received some sort of information stating that patient wanted to have some pharmacogenetics molecular diagnostic tests  I reviewed over this This type of testing is not something that we do. Medical studies really do not show that it is necessarily improves the outcomes.  If she is interested in something like this I can refer her to cardiology to discuss further or refer her to genetics counseling to discuss this further but this is not considered a mainstream medicine approach and is not something that is done by [place].  If she would like to discuss in further details or she feels she is having negative side effects with medications I would recommend a follow-up visit.

## 2023-07-01 ENCOUNTER — Encounter: Payer: Self-pay | Admitting: *Deleted

## 2023-07-01 ENCOUNTER — Other Ambulatory Visit: Payer: Self-pay | Admitting: Family Medicine

## 2023-07-01 NOTE — Telephone Encounter (Signed)
Patient notified of provider's recommendations via my chart

## 2023-07-14 ENCOUNTER — Other Ambulatory Visit: Payer: Self-pay

## 2023-07-22 ENCOUNTER — Other Ambulatory Visit: Payer: Self-pay | Admitting: Family Medicine

## 2023-07-22 DIAGNOSIS — Z1212 Encounter for screening for malignant neoplasm of rectum: Secondary | ICD-10-CM

## 2023-07-22 DIAGNOSIS — Z1211 Encounter for screening for malignant neoplasm of colon: Secondary | ICD-10-CM

## 2023-08-02 ENCOUNTER — Other Ambulatory Visit: Payer: Self-pay | Admitting: Family Medicine

## 2023-08-08 DIAGNOSIS — Z1212 Encounter for screening for malignant neoplasm of rectum: Secondary | ICD-10-CM | POA: Diagnosis not present

## 2023-08-08 DIAGNOSIS — Z1211 Encounter for screening for malignant neoplasm of colon: Secondary | ICD-10-CM | POA: Diagnosis not present

## 2023-08-11 ENCOUNTER — Ambulatory Visit: Payer: Medicare PPO | Admitting: Family Medicine

## 2023-08-15 LAB — COLOGUARD: COLOGUARD: NEGATIVE

## 2023-08-17 ENCOUNTER — Encounter: Payer: Self-pay | Admitting: Family Medicine

## 2023-08-18 NOTE — Telephone Encounter (Signed)
Nurses May go up to [facility] 12.5 mg once per week 1 month supply with 1 refill Please have Silvia do her labs somewhere in the next 7 to 10 days they are already ordered She needs to go ahead and schedule an office visit before the slots fill up to be seen in November or early December thank you

## 2023-08-19 ENCOUNTER — Other Ambulatory Visit: Payer: Self-pay

## 2023-08-19 DIAGNOSIS — E119 Type 2 diabetes mellitus without complications: Secondary | ICD-10-CM

## 2023-08-19 MED ORDER — TIRZEPATIDE 12.5 MG/0.5ML ~~LOC~~ SOAJ
12.5000 mg | SUBCUTANEOUS | 1 refills | Status: DC
Start: 2023-08-19 — End: 2023-10-25

## 2023-09-07 DIAGNOSIS — E1169 Type 2 diabetes mellitus with other specified complication: Secondary | ICD-10-CM | POA: Diagnosis not present

## 2023-09-07 DIAGNOSIS — E785 Hyperlipidemia, unspecified: Secondary | ICD-10-CM | POA: Diagnosis not present

## 2023-09-07 DIAGNOSIS — I10 Essential (primary) hypertension: Secondary | ICD-10-CM | POA: Diagnosis not present

## 2023-09-07 DIAGNOSIS — E119 Type 2 diabetes mellitus without complications: Secondary | ICD-10-CM | POA: Diagnosis not present

## 2023-09-08 ENCOUNTER — Encounter: Payer: Self-pay | Admitting: Family Medicine

## 2023-09-08 ENCOUNTER — Ambulatory Visit: Payer: Medicare PPO | Admitting: Family Medicine

## 2023-09-08 VITALS — BP 134/82 | HR 89 | Temp 97.7°F | Ht 64.0 in | Wt 185.2 lb

## 2023-09-08 DIAGNOSIS — E66811 Obesity, class 1: Secondary | ICD-10-CM

## 2023-09-08 DIAGNOSIS — I10 Essential (primary) hypertension: Secondary | ICD-10-CM | POA: Diagnosis not present

## 2023-09-08 DIAGNOSIS — Z7984 Long term (current) use of oral hypoglycemic drugs: Secondary | ICD-10-CM

## 2023-09-08 DIAGNOSIS — E785 Hyperlipidemia, unspecified: Secondary | ICD-10-CM

## 2023-09-08 DIAGNOSIS — E119 Type 2 diabetes mellitus without complications: Secondary | ICD-10-CM

## 2023-09-08 DIAGNOSIS — E1169 Type 2 diabetes mellitus with other specified complication: Secondary | ICD-10-CM | POA: Diagnosis not present

## 2023-09-08 NOTE — Patient Instructions (Signed)
thank you !!

## 2023-09-08 NOTE — Progress Notes (Signed)
Subjective:    Patient ID: Beth Mayo, female    DOB: [birth year], 72 y.o.   MRN: [id number]  Discussed the use of AI scribe software for clinical note transcription with the patient, who gave verbal consent to proceed.  History of Present Illness   The patient, with a history of diabetes, presents with a recent improvement in her condition, as evidenced by a decrease in her A1C levels from 6.8 to 5.8 over the past year. She reports tolerating her current medication regimen well, which includes metformin and Mounjaro, with no associated nausea. The patient notes a decrease in appetite and subsequent weight loss, with her weight decreasing from 208 to 185 pounds over the past year.  The patient also reports a significant improvement in her cholesterol levels, with her LDL decreasing to 38. She has been adherent to her cholesterol and blood pressure medications. However, she has been experiencing nocturnal awakenings, which she describes as a strange sensation, lasting for a few minutes.  The patient has also been experiencing nasal congestion, for which she has been using Afrin and an over-the-counter decongestant. However, she has been advised to discontinue these due to potential effects on her blood pressure.  The patient has been proactive in scheduling her mammogram and Pap smear, and has had a recent eye exam. She has not yet received her flu shot and is considering the RSV vaccine. She has not received the COVID-19 vaccine and is hesitant due to personal reasons.  The patient's diabetes and weight management appear to be improving on her current medication regimen. However, her sleep disturbances and nasal congestion remain concerns.       Patient for blood pressure check up.  The patient does have hypertension.   Patient relates dietary measures try to minimize salt The importance of healthy diet and activity were discussed Patient relates compliance  Patient here for follow-up  regarding cholesterol.    Patient relates taking medication on a regular basis Denies problems with medication Importance of dietary measures discussed Regular lab work regarding lipid and liver was checked and if needing additional labs was appropriately ordered  The patient was seen today as part of a comprehensive diabetic check up. Patient has diabetes Patient relates good compliance with taking the medication. We discussed their diet and exercise activities  We also discussed the importance of notifying us if any excessively high glucoses or low sugars.   Results for orders placed or performed in visit on 07/22/23  Cologuard  Result Value Ref Range   COLOGUARD Negative Negative   Urine micro protein is pending, A1c 5.9 peak was 6.8 in February 2023, LDL 38, liver enzymes normal, GFR looks good,  Review of Systems     Objective:    Physical Exam   VITALS: BP- 134/82 MEASUREMENTS: WT- 185 EXTREMITIES: No ankle edema.     General-in no acute distress Eyes-no discharge Lungs-respiratory rate normal, CTA CV-no murmurs,RRR Extremities skin warm dry no edema Neuro grossly normal Behavior normal, alert       Assessment & Plan:  Assessment and Plan    Type 2 Diabetes Mellitus Well controlled with Metformin and Mounjaro. A1c has been decreasing over time, currently at 5.8. Weight loss noted. -Continue Metformin and Mounjaro 12.5mg  weekly. -Check A1c in 6 months.  Hyperlipidemia LDL well controlled at 38. -Continue current cholesterol medication.  Hypertension Blood pressure slightly elevated at the visit, but patient reports lower readings at home. -Continue current blood pressure medication. -Advise patient to monitor  blood pressure at home and report any consistently high readings.  Nasal Congestion Patient using Afrin and decongestant pills, which may be contributing to elevated blood pressure. -Discontinue Afrin and decongestant pills. -Recommend  over-the-counter Allegra, Flonase, and saline nasal spray for symptom management. -Advise use of a humidifier in the home.  Sleep Disturbance Patient reports waking up in the middle of the night. -Advise patient to avoid screens if awake during the night and try reading until feeling sleepy again.  General Health Maintenance -Continue annual mammograms, next scheduled for October 10, 2023. -Consider Pap smear this year, depending on gynecologist's recommendation. -Continue annual eye exams, last done in June 2024. -Repeat Cologuard test every 3 years. -Consider flu shot and RSV vaccine for this year. -Follow-up in 6 months.      1. Diabetes mellitus without complication (HCC) Patient benefits from ongoing care of her diabetes A1c is coming down but it is still medically necessary for her to be on GLP-1 to keep good management of that plus also minimize risk of heart attack strokes  2. Hypertension, unspecified type Blood pressure good control continue current measures  3. Hyperlipidemia associated with type 2 diabetes mellitus (HCC) Cholesterol very good continue current measures  4. Obesity (BMI 30.0-34.9) Portion control regular physical activity  Follow-up within 6 months

## 2023-09-09 ENCOUNTER — Encounter: Payer: Self-pay | Admitting: Family Medicine

## 2023-09-09 LAB — LIPID PANEL
Chol/HDL Ratio: 2.3 {ratio} (ref 0.0–4.4)
Cholesterol, Total: 104 mg/dL (ref 100–199)
HDL: 45 mg/dL (ref >39)
LDL Chol Calc (NIH): 38 mg/dL (ref 0–99)
Triglycerides: 114 mg/dL (ref 0–149)
VLDL Cholesterol Cal: 21 mg/dL (ref 5–40)

## 2023-09-09 LAB — MICROALBUMIN / CREATININE URINE RATIO
Creatinine, Urine: 70.8 mg/dL
Microalb/Creat Ratio: 9 mg/g{creat} (ref 0–29)
Microalbumin, Urine: 6.3 ug/mL

## 2023-09-09 LAB — HEMOGLOBIN A1C
Est. average glucose Bld gHb Est-mCnc: 123 mg/dL
Hgb A1c MFr Bld: 5.9 % — ABNORMAL HIGH (ref 4.8–5.6)

## 2023-09-09 LAB — BASIC METABOLIC PANEL (7)
BUN/Creatinine Ratio: 8 — ABNORMAL LOW (ref 12–28)
BUN: 8 mg/dL (ref 8–27)
CO2: 24 mmol/L (ref 20–29)
Chloride: 103 mmol/L (ref 96–106)
Creatinine, Ser: 0.95 mg/dL (ref 0.57–1.00)
Glucose: 107 mg/dL — ABNORMAL HIGH (ref 70–99)
Potassium: 4.4 mmol/L (ref 3.5–5.2)
Sodium: 145 mmol/L — ABNORMAL HIGH (ref 134–144)
eGFR: 64 mL/min/{1.73_m2} (ref >59)

## 2023-09-09 LAB — HEPATIC FUNCTION PANEL
ALT: 17 [IU]/L (ref 0–32)
AST: 23 [IU]/L (ref 0–40)
Albumin: 4.6 g/dL (ref 3.9–4.9)
Alkaline Phosphatase: 90 [IU]/L (ref 44–121)
Bilirubin Total: 0.6 mg/dL (ref 0.0–1.2)
Bilirubin, Direct: 0.22 mg/dL (ref 0.00–0.40)
Total Protein: 6.9 g/dL (ref 6.0–8.5)

## 2023-09-25 ENCOUNTER — Other Ambulatory Visit: Payer: Self-pay | Admitting: Family Medicine

## 2023-10-05 ENCOUNTER — Encounter: Payer: Self-pay | Admitting: Family Medicine

## 2023-10-10 DIAGNOSIS — Z01419 Encounter for gynecological examination (general) (routine) without abnormal findings: Secondary | ICD-10-CM | POA: Diagnosis not present

## 2023-10-11 NOTE — Telephone Encounter (Signed)
 Care team updated and letter sent for eye exam notes.

## 2023-10-24 ENCOUNTER — Other Ambulatory Visit: Payer: Self-pay | Admitting: Family Medicine

## 2023-10-24 DIAGNOSIS — E119 Type 2 diabetes mellitus without complications: Secondary | ICD-10-CM

## 2023-10-25 NOTE — Telephone Encounter (Signed)
 Nurses Please abstract that she had her RSV vaccine on January 2  As for the Mounjaro -we can prescribe it as a 90-day supply but whether or not it is cheaper depends on the [employer].  With the higher cost medications such as Mounjaro  it is important to be aware that when getting 90-day supply-a person is locked into that does for 90 days in other words insurance will not allow for a dosing change 30 days later because of the high cost of these medicines  As for whether or not 30-day versus 90-day it is cheaper 1 way or the other-she should call her insurance company and talk with them regarding this issue once she finds out that information she can get back with us   Thanks-Dr. Glendia

## 2023-10-26 ENCOUNTER — Telehealth: Payer: Self-pay

## 2023-10-26 NOTE — Telephone Encounter (Signed)
 Called pt and went over Dr Alphonsa Comments on Mounjaro  supply As for the Mounjaro -we can prescribe it as a 90-day supply but whether or not it is cheaper depends on the person's insurance.  With the higher cost medications such as Mounjaro  it is important to be aware that when getting 90-day supply-a person is locked into that does for 90 days in other words insurance will not allow for a dosing change 30 days later because of the high cost of these medicines   As for whether or not 30-day versus 90-day it is cheaper 1 way or the other-she should call her insurance company and talk with them regarding this issue once she finds out that information she can get back with us - Dr Alphonsa  Pt will discuss with her Insurance then inform us  of her dessision

## 2023-11-02 ENCOUNTER — Other Ambulatory Visit: Payer: Self-pay | Admitting: Family Medicine

## 2023-11-02 ENCOUNTER — Telehealth: Payer: Self-pay

## 2023-11-02 DIAGNOSIS — Z1231 Encounter for screening mammogram for malignant neoplasm of breast: Secondary | ICD-10-CM | POA: Diagnosis not present

## 2023-11-02 NOTE — Telephone Encounter (Signed)
 Pt has [employer] and insurance sent a letter letter patient know that her medication for Mounjaro  12.5/0.4ml pen is no longer formulary. Pt will need to be switched to a new medication.   Beth Mayo [phone]  [facility]

## 2023-11-03 NOTE — Telephone Encounter (Signed)
Beth Mayo Please explain to patient or send her a MyChart message letting her know that with the insurance company stopping Mounjaro we will have to switch over to a different GLP-1 with this he does require starting at a lower dose and building back up I would recommend starting at 0.5 mg Ozempic once weekly 1 month supply 2 refills   Sadly her insurance company may require her to do a prior approval on that medicine as well and it will require filling out some forms or even phone discussion If there is any way I can help let me know thanks She has a regular follow-up in May Once she starts on the new dose 1 month into it if she is doing well she is to let us know we can always bump up the dose thank you

## 2023-11-04 NOTE — Telephone Encounter (Signed)
Patient notified and verbalized understanding. 

## 2023-11-08 ENCOUNTER — Other Ambulatory Visit: Payer: Self-pay | Admitting: Obstetrics and Gynecology

## 2023-11-08 DIAGNOSIS — R928 Other abnormal and inconclusive findings on diagnostic imaging of breast: Secondary | ICD-10-CM

## 2023-11-11 ENCOUNTER — Encounter: Payer: Self-pay | Admitting: Obstetrics and Gynecology

## 2023-11-17 ENCOUNTER — Ambulatory Visit
Admission: RE | Admit: 2023-11-17 | Discharge: 2023-11-17 | Disposition: A | Discharge status: Home / Self Care | Payer: Medicare PPO | Source: Ambulatory Visit | Attending: Obstetrics and Gynecology | Admitting: Obstetrics and Gynecology

## 2023-11-17 DIAGNOSIS — R928 Other abnormal and inconclusive findings on diagnostic imaging of breast: Secondary | ICD-10-CM

## 2023-11-17 DIAGNOSIS — N6321 Unspecified lump in the left breast, upper outer quadrant: Secondary | ICD-10-CM | POA: Diagnosis not present

## 2023-11-18 ENCOUNTER — Other Ambulatory Visit: Payer: Self-pay | Admitting: Obstetrics and Gynecology

## 2023-11-18 DIAGNOSIS — N6320 Unspecified lump in the left breast, unspecified quadrant: Secondary | ICD-10-CM

## 2023-11-23 ENCOUNTER — Telehealth: Payer: Self-pay | Admitting: Pharmacist

## 2023-12-21 ENCOUNTER — Other Ambulatory Visit: Payer: Self-pay | Admitting: Family Medicine

## 2024-01-17 ENCOUNTER — Encounter: Payer: Self-pay | Admitting: Family Medicine

## 2024-01-18 ENCOUNTER — Other Ambulatory Visit: Payer: Self-pay

## 2024-01-18 ENCOUNTER — Telehealth: Payer: Self-pay | Admitting: Family Medicine

## 2024-01-18 DIAGNOSIS — E1169 Type 2 diabetes mellitus with other specified complication: Secondary | ICD-10-CM

## 2024-01-18 DIAGNOSIS — I10 Essential (primary) hypertension: Secondary | ICD-10-CM

## 2024-01-18 DIAGNOSIS — E119 Type 2 diabetes mellitus without complications: Secondary | ICD-10-CM

## 2024-01-18 DIAGNOSIS — Z79899 Other long term (current) drug therapy: Secondary | ICD-10-CM

## 2024-01-18 NOTE — Telephone Encounter (Signed)
 Nurses Patient is aware Please order lipid, liver, metabolic 7, A1c Diabetes, hyperlipidemia, high risk med patient will do this before her follow-up office visit in May

## 2024-01-19 ENCOUNTER — Other Ambulatory Visit: Payer: Self-pay | Admitting: Family Medicine

## 2024-01-19 DIAGNOSIS — E119 Type 2 diabetes mellitus without complications: Secondary | ICD-10-CM

## 2024-01-19 MED ORDER — MOUNJARO 12.5 MG/0.5ML ~~LOC~~ SOAJ: 12.5000 mg | SUBCUTANEOUS | 4 refills | Status: DC

## 2024-01-27 ENCOUNTER — Other Ambulatory Visit: Payer: Self-pay | Admitting: Family Medicine

## 2024-01-29 ENCOUNTER — Other Ambulatory Visit: Payer: Self-pay | Admitting: Family Medicine

## 2024-01-30 ENCOUNTER — Other Ambulatory Visit: Payer: Self-pay

## 2024-01-30 MED ORDER — TRIAMTERENE-HCTZ 37.5-25 MG PO CAPS: 1.0000 | ORAL_CAPSULE | Freq: Every day | ORAL | 3 refills | Status: AC

## 2024-02-29 DIAGNOSIS — Z79899 Other long term (current) drug therapy: Secondary | ICD-10-CM | POA: Diagnosis not present

## 2024-02-29 DIAGNOSIS — I10 Essential (primary) hypertension: Secondary | ICD-10-CM | POA: Diagnosis not present

## 2024-02-29 DIAGNOSIS — E1169 Type 2 diabetes mellitus with other specified complication: Secondary | ICD-10-CM | POA: Diagnosis not present

## 2024-02-29 DIAGNOSIS — E119 Type 2 diabetes mellitus without complications: Secondary | ICD-10-CM | POA: Diagnosis not present

## 2024-02-29 DIAGNOSIS — E785 Hyperlipidemia, unspecified: Secondary | ICD-10-CM | POA: Diagnosis not present

## 2024-03-01 LAB — BASIC METABOLIC PANEL WITH GFR
BUN/Creatinine Ratio: 10 — ABNORMAL LOW (ref 12–28)
BUN: 10 mg/dL (ref 8–27)
CO2: 25 mmol/L (ref 20–29)
Calcium: 10.5 mg/dL — ABNORMAL HIGH (ref 8.7–10.3)
Chloride: 102 mmol/L (ref 96–106)
Creatinine, Ser: 0.96 mg/dL (ref 0.57–1.00)
Glucose: 92 mg/dL (ref 70–99)
Potassium: 4.3 mmol/L (ref 3.5–5.2)
Sodium: 144 mmol/L (ref 134–144)
eGFR: 64 mL/min/{1.73_m2} (ref >59)

## 2024-03-01 LAB — LIPID PANEL
Chol/HDL Ratio: 2.6 ratio (ref 0.0–4.4)
Cholesterol, Total: 129 mg/dL (ref 100–199)
HDL: 50 mg/dL (ref >39)
LDL Chol Calc (NIH): 50 mg/dL (ref 0–99)
Triglycerides: 176 mg/dL — ABNORMAL HIGH (ref 0–149)
VLDL Cholesterol Cal: 29 mg/dL (ref 5–40)

## 2024-03-01 LAB — HEPATIC FUNCTION PANEL
ALT: 18 IU/L (ref 0–32)
AST: 24 IU/L (ref 0–40)
Albumin: 4.8 g/dL (ref 3.9–4.9)
Alkaline Phosphatase: 98 IU/L (ref 44–121)
Bilirubin Total: 0.7 mg/dL (ref 0.0–1.2)
Bilirubin, Direct: 0.25 mg/dL (ref 0.00–0.40)
Total Protein: 7.6 g/dL (ref 6.0–8.5)

## 2024-03-01 LAB — HEMOGLOBIN A1C
Est. average glucose Bld gHb Est-mCnc: 120 mg/dL
Hgb A1c MFr Bld: 5.8 % — ABNORMAL HIGH (ref 4.8–5.6)

## 2024-03-05 ENCOUNTER — Ambulatory Visit: Payer: Self-pay | Admitting: Family Medicine

## 2024-03-07 ENCOUNTER — Ambulatory Visit: Payer: Medicare PPO | Admitting: Family Medicine

## 2024-03-07 ENCOUNTER — Encounter: Payer: Self-pay | Admitting: Family Medicine

## 2024-03-07 VITALS — BP 126/79 | HR 74 | Temp 97.3°F | Ht 64.0 in | Wt 177.0 lb

## 2024-03-07 DIAGNOSIS — Z1321 Encounter for screening for nutritional disorder: Secondary | ICD-10-CM | POA: Diagnosis not present

## 2024-03-07 DIAGNOSIS — Z7985 Long-term (current) use of injectable non-insulin antidiabetic drugs: Secondary | ICD-10-CM | POA: Diagnosis not present

## 2024-03-07 DIAGNOSIS — E1169 Type 2 diabetes mellitus with other specified complication: Secondary | ICD-10-CM | POA: Diagnosis not present

## 2024-03-07 DIAGNOSIS — E119 Type 2 diabetes mellitus without complications: Secondary | ICD-10-CM

## 2024-03-07 DIAGNOSIS — I10 Essential (primary) hypertension: Secondary | ICD-10-CM | POA: Diagnosis not present

## 2024-03-07 DIAGNOSIS — E1159 Type 2 diabetes mellitus with other circulatory complications: Secondary | ICD-10-CM

## 2024-03-07 DIAGNOSIS — E785 Hyperlipidemia, unspecified: Secondary | ICD-10-CM | POA: Diagnosis not present

## 2024-03-07 NOTE — Progress Notes (Signed)
   Subjective:    Patient ID: Beth Mayo, female    DOB: 07-Aug-1951, 73 y.o.   MRN: [id number]  HPI Discussed the use of AI scribe software for clinical note transcription with the patient, who gave verbal consent to proceed.  History of Present Illness   Beth Mayo is a 73 year old female with diabetes who presents for follow-up regarding her medication and lab results.  She is currently on Mounjaro , administered once a week on Fridays. Her weight has stagnated, but her A1c has improved to 5.8. She is concerned about her stomach not reducing in size despite weight loss.  She follows a pescatarian diet, consuming a lot of vegetables and fish, including white fish, shrimp, lobster, and scallops. She occasionally eats hot dogs but does not consume other meats. She drinks a lot of milk and sometimes consumes ice cream and yogurt.  She engages in physical activity by walking to the lake and partially up a hill, although she used to walk the entire hill. She is trying to get used to the hill again.  Recent lab results show elevated calcium  levels. She does not take calcium  supplements but drinks a lot of milk. She is concerned about the potential causes of this elevation.  She [place] area [place] and enjoys walking there. She has a granddaughter in the fifth grade who attends school in [place].  She tolerates her current medication, Mounjaro , well. No issues with liver enzymes or cholesterol levels, which are well-controlled.        Review of Systems     Objective:   Physical Exam  General-in no acute distress Eyes-no discharge Lungs-respiratory rate normal, CTA CV-no murmurs,RRR Extremities skin warm dry no edema Neuro grossly normal Behavior normal, alert  Diabetic foot exam normal     Assessment & Plan:   Assessment and Plan    Type 2 diabetes mellitus Diabetes well-controlled with A1c of 5.8. Mounjaro  effective and well-tolerated. Weight stable. Insurance  may necessitate medication change. - Continue Mounjaro  once weekly. - Perform diabetic foot exam. - Ensure follow-up with eye exam.  Elevated calcium  level Elevated calcium  levels noted. Possible parathyroid hyperactivity or vitamin D issues. Further investigation required. - Order additional blood tests to evaluate parathyroid function and vitamin D levels.      1. Diabetes mellitus without complication (HCC) (Primary) A1c much better than what it was very important for her to continue GLP-1 medication and diet  2. Hypertension, unspecified type Blood pressure good control continue current measures  3. Hyperlipidemia associated with type 2 diabetes mellitus (HCC) Cholesterol very good control continue current measures  4. Hypercalcemia Lab work did show increase of calcium  check lab additional labs - PTH, Intact and Calcium  - Vitamin D, 25-hydroxy  5. Encounter for vitamin deficiency screening Lab - Vitamin D, 25-hydroxy

## 2024-03-09 ENCOUNTER — Ambulatory Visit: Payer: Self-pay | Admitting: Family Medicine

## 2024-03-09 LAB — VITAMIN D 25 HYDROXY (VIT D DEFICIENCY, FRACTURES): Vit D, 25-Hydroxy: 82.5 ng/mL (ref 30.0–100.0)

## 2024-03-09 LAB — PTH, INTACT AND CALCIUM
Calcium: 10.3 mg/dL (ref 8.7–10.3)
PTH: 21 pg/mL (ref 15–65)

## 2024-03-10 ENCOUNTER — Encounter: Payer: Self-pay | Admitting: Family Medicine

## 2024-04-09 ENCOUNTER — Other Ambulatory Visit: Payer: Self-pay | Admitting: Family Medicine

## 2024-04-10 NOTE — Telephone Encounter (Unsigned)
 Copied from CRM [id number]. Topic: Clinical - Medication Refill >> Apr 10, 2024  4:09 PM Tiffini S wrote: Medication: tirzepatide  (MOUNJARO ) 12.5 MG/0.5ML Pen  Has the patient contacted their pharmacy? Yes[facility]   This is the patient's preferred pharmacy:  [id number] [place] [place] [facility] [address] [facility] [address] [place] [address] Phone: [phone] Fax: [phone]    Is this the correct pharmacy for this prescription? Yes If no, delete pharmacy and type the correct one.   Has the prescription been filled recently? Yes  Is the patient out of the medication? Yes, Patient will need the next pen for this Friday June 27th. Patient is out of pens.   Has the patient been seen for an appointment in the last year OR does the patient have an upcoming appointment? Yes  Can we respond through MyChart? Yes  Agent: Please be advised that Rx refills may take up to 3 business days. We ask that you follow-up with your pharmacy.

## 2024-04-12 ENCOUNTER — Other Ambulatory Visit: Payer: Self-pay | Admitting: Family Medicine

## 2024-05-16 ENCOUNTER — Ambulatory Visit
Admission: RE | Admit: 2024-05-16 | Discharge: 2024-05-16 | Disposition: A | Discharge status: Home / Self Care | Payer: Medicare PPO | Source: Ambulatory Visit | Attending: Obstetrics and Gynecology | Admitting: Obstetrics and Gynecology

## 2024-05-16 ENCOUNTER — Ambulatory Visit: Payer: Medicare PPO

## 2024-05-16 DIAGNOSIS — N6320 Unspecified lump in the left breast, unspecified quadrant: Secondary | ICD-10-CM

## 2024-05-16 DIAGNOSIS — R928 Other abnormal and inconclusive findings on diagnostic imaging of breast: Secondary | ICD-10-CM | POA: Diagnosis not present

## 2024-05-16 DIAGNOSIS — R92322 Mammographic fibroglandular density, left breast: Secondary | ICD-10-CM | POA: Diagnosis not present

## 2024-05-16 LAB — HM MAMMOGRAPHY

## 2024-06-04 ENCOUNTER — Telehealth: Payer: Self-pay | Admitting: *Deleted

## 2024-06-04 NOTE — Telephone Encounter (Unsigned)
 Copied from CRM #[id number]. Topic: Clinical - Medical Advice >> Jun 04, 2024  3:19 PM DeAngela L wrote: Reason for CRM: patient called to in form the office she has tested positive for covid and calling to ask what should she do She had symptoms yesterday and none today just a really bad headache now   Pt num [phone]

## 2024-06-04 NOTE — Telephone Encounter (Signed)
 Spoke with patient. Symptoms started Saturday. Had a bad headache up until today. Much improved today. Discussed use of Paxlovid but will hold since she has improved. Keep us  posted tomorrow in case we need to start medication.

## 2024-06-05 ENCOUNTER — Encounter: Payer: Self-pay | Admitting: Family Medicine

## 2024-06-07 ENCOUNTER — Other Ambulatory Visit: Payer: Self-pay | Admitting: Family Medicine

## 2024-06-07 DIAGNOSIS — E119 Type 2 diabetes mellitus without complications: Secondary | ICD-10-CM

## 2024-06-08 ENCOUNTER — Other Ambulatory Visit: Payer: Self-pay

## 2024-06-08 DIAGNOSIS — E119 Type 2 diabetes mellitus without complications: Secondary | ICD-10-CM

## 2024-06-08 MED ORDER — ROSUVASTATIN CALCIUM 10 MG PO TABS: 10.0000 mg | ORAL_TABLET | Freq: Every day | ORAL | 1 refills | Status: AC

## 2024-06-08 MED ORDER — MOUNJARO 12.5 MG/0.5ML ~~LOC~~ SOAJ: 12.5000 mg | SUBCUTANEOUS | 4 refills | Status: AC

## 2024-06-21 ENCOUNTER — Telehealth: Payer: Self-pay | Admitting: Family Medicine

## 2024-06-21 DIAGNOSIS — S92354A Nondisplaced fracture of fifth metatarsal bone, right foot, initial encounter for closed fracture: Secondary | ICD-10-CM | POA: Diagnosis not present

## 2024-06-21 NOTE — Telephone Encounter (Signed)
 Patient sustained a fracture of the fifth metatarsal she is under the care of [facility] she will keep us  posted with any needs

## 2024-06-27 DIAGNOSIS — S92301A Fracture of unspecified metatarsal bone(s), right foot, initial encounter for closed fracture: Secondary | ICD-10-CM | POA: Diagnosis not present

## 2024-06-29 ENCOUNTER — Ambulatory Visit: Payer: Medicare PPO

## 2024-06-29 VITALS — Ht 64.0 in | Wt 177.0 lb

## 2024-06-29 DIAGNOSIS — Z0000 Encounter for general adult medical examination without abnormal findings: Secondary | ICD-10-CM | POA: Diagnosis not present

## 2024-06-29 NOTE — Patient Instructions (Signed)
 Beth Mayo,  Thank you for taking the time for your Medicare Wellness Visit. I appreciate your continued commitment to your health goals. Please review the care plan we discussed, and feel free to reach out if I can assist you further.  Medicare recommends these wellness visits once per year to help you and your care team stay ahead of potential health issues. These visits are designed to focus on prevention, allowing your provider to concentrate on managing your acute and chronic conditions during your regular appointments.  Please note that Annual Wellness Visits do not include a physical exam. Some assessments may be limited, especially if the visit was conducted virtually. If needed, we may recommend a separate in-person follow-up with your provider.  Ongoing Care Seeing your primary care provider every 3 to 6 months helps us  monitor your health and provide consistent, personalized care.   Referrals If a referral was made during today's visit and you haven't received any updates within two weeks, please contact the referred provider directly to check on the status.  Recommended Screenings:  Health Maintenance  Topic Date Due   Breast Cancer Screening  10/28/2022   DEXA scan (bone density measurement)  12/14/2023   Complete foot exam   02/09/2024   Flu Shot  05/18/2024   COVID-19 Vaccine (6 - 2025-26 season) 06/18/2024   Hemoglobin A1C  08/31/2024   Yearly kidney health urinalysis for diabetes  09/06/2024   Yearly kidney function blood test for diabetes  02/28/2025   Eye exam for diabetics  03/07/2025   Medicare Annual Wellness Visit  06/29/2025   Cologuard (Stool DNA test)  08/07/2026   DTaP/Tdap/Td vaccine (2 - Td or Tdap) 11/09/2033   Pneumococcal Vaccine for age over 35  Completed   Hepatitis C Screening  Completed   Zoster (Shingles) Vaccine  Completed   HPV Vaccine  Aged Out   Meningitis B Vaccine  Aged Out   Colon Cancer Screening  Discontinued       06/29/2024    9:31  AM  Advanced Directives  Does Patient Have a Medical Advance Directive? No  Would patient like information on creating a medical advance directive? Yes (MAU/Ambulatory/Procedural Areas - Information given)   Advance Care Planning is important because it: Ensures you receive medical care that aligns with your values, goals, and preferences. Provides guidance to your family and loved ones, reducing the emotional burden of decision-making during critical moments.  Information on Advanced Care Planning can be found at [place]  Secretary of [facility] Advance Health Care Directives Advance Health Care Directives ([email or web address]   Vision: Annual vision screenings are recommended for early detection of glaucoma, cataracts, and diabetic retinopathy. These exams can also reveal signs of chronic conditions such as diabetes and high blood pressure.  Dental: Annual dental screenings help detect early signs of oral cancer, gum disease, and other conditions linked to overall health, including heart disease and diabetes.  Please see the attached documents for additional preventive care recommendations.

## 2024-06-29 NOTE — Progress Notes (Signed)
 Subjective:   Beth Mayo is a 73 y.o. who presents for a Medicare Wellness preventive visit.  As a reminder, Annual Wellness Visits don't include a physical exam, and some assessments may be limited, especially if this visit is performed virtually. We may recommend an in-person follow-up visit with your provider if needed.  Visit Complete: Virtual I connected with  Beth Mayo on 06/29/24 by a video and audio enabled telemedicine application and verified that I am speaking with the correct person using two identifiers.  Patient Location: Home  Provider Location: Home Office  I discussed the limitations of evaluation and management by telemedicine. The patient expressed understanding and agreed to proceed.  Vital Signs: Because this visit was a virtual/telehealth visit, some criteria may be missing or patient reported. Any vitals not documented were not able to be obtained and vitals that have been documented are patient reported.  Persons Participating in Visit: Patient.  AWV Questionnaire: Yes: Patient Medicare AWV questionnaire was completed by the patient on 9/9/256; I have confirmed that all information answered by patient is correct and no changes since this date.  Cardiac Risk Factors include: advanced age (>38men, >63 women);diabetes mellitus;dyslipidemia;hypertension     Objective:    Today's Vitals   06/29/24 0924  Weight: 177 lb (80.3 kg)  Height: 5' 4 (1.626 m)   Body mass index is 30.38 kg/m.     06/29/2024    9:31 AM 06/24/2023    8:57 AM 06/16/2022    9:31 AM 04/21/2021   10:24 AM 09/27/2016    4:56 PM 12/04/2012   12:36 PM  Advanced Directives  Does Patient Have a Medical Advance Directive? No No No No No  Patient does not have advance directive;Patient would not like information   Would patient like information on creating a medical advance directive? Yes (MAU/Ambulatory/Procedural Areas - Information given) No - Patient declined No - Patient declined No -  Patient declined       Data saved with a previous flowsheet row definition    Current Medications (verified) Outpatient Encounter Medications as of 06/29/2024  Medication Sig   albuterol  (VENTOLIN  HFA) 108 (90 Base) MCG/ACT inhaler Inhale 2 puffs into the lungs every 4 (four) hours as needed for wheezing or shortness of breath.   Biotin 89999 MCG TABS Take by mouth daily.   cetirizine (ZYRTEC) 10 MG tablet Take 10 mg by mouth daily.   Cholecalciferol (VITAMIN D3) 3000 units TABS Take by mouth daily.   ibuprofen  (ADVIL ) 600 MG tablet Take 1 tablet (600 mg total) by mouth every 8 (eight) hours as needed.   metFORMIN  (GLUCOPHAGE ) 500 MG tablet TAKE 1 TABLET BY MOUTH EVERY DAY   Methylcobalamin (B-12) 5000 MCG TBDP Take by mouth daily.   pantoprazole  (PROTONIX ) 40 MG tablet TAKE 1 TABLET BY MOUTH EVERY DAY (Patient taking differently: TAKE 1 TABLET BY MOUTH EVERY DAY)   rosuvastatin  (CRESTOR ) 10 MG tablet Take 1 tablet (10 mg total) by mouth daily.   tirzepatide  (MOUNJARO ) 12.5 MG/0.5ML Pen Inject 12.5 mg into the skin once a week.   triamterene -hydrochlorothiazide (DYAZIDE) 37.5-25 MG capsule Take 1 each (1 capsule total) by mouth daily.   No facility-administered encounter medications on file as of 06/29/2024.    Allergies (verified) Ace inhibitors, Codeine, and Phenobarbital   History: Past Medical History:  Diagnosis Date   Angio-edema    Chronic kidney disease    kidney stones   GERD (gastroesophageal reflux disease)    Headache(784.0)  migraines   Hypertension    Spinal headache    Urticaria    Past Surgical History:  Procedure Laterality Date   BREAST SURGERY     breast reduction   CESAREAN SECTION     X2   COLONOSCOPY  01/18/2008     SLF: Moderate internal hemorrhoids.Otherwise no polyps, masses inflammatory changes, diverticular  AVMs.   DILATION AND CURETTAGE OF UTERUS     for miscarriage   ESOPHAGOGASTRODUODENOSCOPY N/A 12/04/2012   DOQ:Dryjusxp ring was found  at the gastroesophageal junction/Small hiatal hernia/ MODERATE Erosive gastritis/MILD DUODENITIS in the bulb and second portion of the duodenum/NEW ONSET DYSPEPSIA DUE TO NSAID INDUCED GASTRITIS/DUODENITIS, negative path   rotator cuff right Right    TONSILLECTOMY     TUBAL LIGATION     Family History  Problem Relation Age of Onset   Colon cancer Neg Hx    Allergic rhinitis Neg Hx    Angioedema Neg Hx    Asthma Neg Hx    Atopy Neg Hx    Eczema Neg Hx    Immunodeficiency Neg Hx    Urticaria Neg Hx    Social History   Socioeconomic History   Marital status: Married    Spouse name: Not on file   Number of children: Not on file   Years of education: Not on file   Highest education level: Master's degree (e.g., MA, MS, MEng, MEd, MSW, MBA)  Occupational History   Occupation: retired    Comment: Engineer, site, 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  Tobacco Use   Smoking status: Never   Smokeless tobacco: Never  Substance and Sexual Activity   Alcohol use: Yes    Alcohol/week: 1.0 standard drink of alcohol    Types: 1 Glasses of wine per week    Comment: Social   Drug use: No   Sexual activity: Yes    Birth control/protection: None  Other Topics Concern   Not on file  Social History Narrative   Not on file   Social Drivers of Health   Financial Resource Strain: Low Risk  (06/26/2024)   Overall Financial Resource Strain (CARDIA)    Difficulty of Paying Living Expenses: Not hard at all  Food Insecurity: No Food Insecurity (06/26/2024)   Hunger Vital Sign    Worried About Running Out of Food in the Last Year: Never true    Ran Out of Food in the Last Year: Never true  Transportation Needs: No Transportation Needs (06/26/2024)   PRAPARE - Administrator, Civil Service (Medical): No    Lack of Transportation (Non-Medical): No  Physical Activity: Inactive (06/26/2024)   Exercise Vital Sign    Days of Exercise per Week: 0 days    Minutes of Exercise per Session: 0 min  Stress: No Stress  Concern Present (06/26/2024)   [employer] of Occupational Health - Occupational Stress Questionnaire    Feeling of Stress: Only a little  Social Connections: Socially Integrated (06/26/2024)   Social Connection and Isolation Panel    Frequency of Communication with Friends and Family: Three times a week    Frequency of Social Gatherings with Friends and Family: Twice a week    Attends Religious Services: More than 4 times per year    Active Member of [employer] or Organizations: Yes    Attends Banker Meetings: 1 to 4 times per year    Marital Status: Married    Tobacco Counseling Counseling given: Not Answered    Clinical Intake:  Pre-visit preparation completed: Yes  Pain : No/denies pain  Diabetes: Yes CBG done?: No Did pt. bring in CBG monitor from home?: No  Lab Results  Component Value Date   HGBA1C 5.8 (H) 02/29/2024   HGBA1C 5.9 (H) 09/07/2023   HGBA1C 6.2 (H) 02/04/2023     How often do you need to have someone help you when you read instructions, pamphlets, or other written materials from your doctor or pharmacy?: 1 - Never  Interpreter Needed?: No  Information entered by :: Charmaine Bloodgood LPN   Activities of Daily Living     06/29/2024    9:31 AM  In your present state of health, do you have any difficulty performing the following activities:  Hearing? 0  Vision? 0  Difficulty concentrating or making decisions? 0  Walking or climbing stairs? 0  Dressing or bathing? 0  Doing errands, shopping? 0  Preparing Food and eating ? N  Using the Toilet? N  In the past six months, have you accidently leaked urine? N  Do you have problems with loss of bowel control? N  Managing your Medications? N  Managing your Finances? N  Housekeeping or managing your Housekeeping? N    Patient Care Team: Alphonsa Glendia LABOR, MD as PCP - [facility], [facility]  Lilton Legions, DO as Consulting Physician (Obstetrics  and Gynecology)  I have updated your Care Teams any recent Medical Services you may have received from other providers in the past year.     Assessment:   This is a routine wellness examination for Beth Mayo.  Hearing/Vision screen Hearing Screening - Comments:: Denies hearing difficulties   Vision Screening - Comments:: Wears rx glasses - up to date with routine eye exams with [facility].    Goals Addressed             This Visit's Progress    Remain active and independent   On track      Depression Screen     06/29/2024    9:30 AM 09/08/2023   10:37 AM 06/24/2023    9:02 AM 02/09/2023    9:25 AM 09/28/2022    8:47 AM 08/11/2022    2:37 PM 06/16/2022    9:30 AM  PHQ 2/9 Scores  PHQ - 2 Score 0 0 0 0 0 0 0  PHQ- 9 Score  1  0   0    Fall Risk     06/29/2024    9:30 AM 09/08/2023   10:37 AM 06/24/2023    9:10 AM 02/09/2023    9:24 AM 09/28/2022    8:47 AM  Fall Risk   Falls in the past year? 1 0 0 0 0  Number falls in past yr: 0  0 0 0  Injury with Fall? 1  0 0 0  Risk for fall due to : Orthopedic patient  No Fall Risks  No Fall Risks  Follow up Falls prevention discussed;Education provided;Falls evaluation completed  Falls prevention discussed  Falls evaluation completed      Data saved with a previous flowsheet row definition    MEDICARE RISK AT HOME:  Medicare Risk at Home Any stairs in or around the home?: No If so, are there any without handrails?: No Home free of loose throw rugs in walkways, pet beds, electrical cords, etc?: Yes Adequate lighting in your home to reduce risk of falls?: Yes Life alert?: No Use of a cane, walker or w/c?: No Grab bars in  the bathroom?: Yes Shower chair or bench in shower?: No Elevated toilet seat or a handicapped toilet?: Yes  TIMED UP AND GO:  Was the test performed?  No  Cognitive Function: 6CIT completed        06/29/2024    9:31 AM 06/24/2023    9:01 AM 06/16/2022    9:34 AM  6CIT Screen  What Year? 0 points 0 points  0 points  What month? 0 points 0 points 0 points  What time? 0 points 0 points 0 points  Count back from 20 0 points 0 points 0 points  Months in reverse 0 points 0 points 0 points  Repeat phrase 0 points 0 points 0 points  Total Score 0 points 0 points 0 points    Immunizations Immunization History  Administered Date(s) Administered    sv, Bivalent, Protein Subunit Rsvpref,pf (Abrysvo) 10/20/2023   INFLUENZA, HIGH DOSE SEASONAL PF 12/09/2023   Moderna Sars-Covid-2 Vaccination 12/18/2019, 01/15/2020, 09/24/2020, 02/27/2021   Pneumococcal Conjugate-13 11/28/2018   Pneumococcal Polysaccharide-23 04/01/2020   Tdap 11/10/2023   Unspecified SARS-COV-2 Vaccination 07/07/2022   Zoster Recombinant(Shingrix) 11/25/2022, 12/09/2023    Screening Tests Health Maintenance  Topic Date Due   Mammogram  10/28/2022   DEXA SCAN  12/14/2023   FOOT EXAM  02/09/2024   Influenza Vaccine  05/18/2024   COVID-19 Vaccine (6 - 2025-26 season) 06/18/2024   HEMOGLOBIN A1C  08/31/2024   Diabetic kidney evaluation - Urine ACR  09/06/2024   Diabetic kidney evaluation - eGFR measurement  02/28/2025   OPHTHALMOLOGY EXAM  03/07/2025   Medicare Annual Wellness (AWV)  06/29/2025   Fecal DNA (Cologuard)  08/07/2026   DTaP/Tdap/Td (2 - Td or Tdap) 11/09/2033   Pneumococcal Vaccine: 50+ Years  Completed   Hepatitis C Screening  Completed   Zoster Vaccines- Shingrix  Completed   HPV VACCINES  Aged Out   Meningococcal B Vaccine  Aged Out   Colonoscopy  Discontinued    Health Maintenance Items Addressed: Will request records for mammogram and dexa from gynecologist   Additional Screening:  Vision Screening: Recommended annual ophthalmology exams for early detection of glaucoma and other disorders of the eye. Is the patient up to date with their annual eye exam?  Yes  Who is the provider or what is the name of the office in which the patient attends annual eye exams? [facility].   Dental Screening:  Recommended annual dental exams for proper oral hygiene  Community Resource Referral / Chronic Care Management: CRR required this visit?  No   CCM required this visit?  No   Plan:    I have personally reviewed and noted the following in the patient's chart:   Medical and social history Use of alcohol, tobacco or illicit drugs  Current medications and supplements including opioid prescriptions. Patient is not currently taking opioid prescriptions. Functional ability and status Nutritional status Physical activity Advanced directives List of other physicians Hospitalizations, surgeries, and ER visits in previous 12 months Vitals Screenings to include cognitive, depression, and falls Referrals and appointments  In addition, I have reviewed and discussed with patient certain preventive protocols, quality metrics, and best practice recommendations. A written personalized care plan for preventive services as well as general preventive health recommendations were provided to patient.   Lavelle Pfeiffer [place], [place]   [id number]   After Visit Summary: (MyChart) Due to this being a telephonic visit, the after visit summary with patients personalized plan was offered to patient via MyChart   Notes: Nothing  significant to report at this time.

## 2024-07-18 DIAGNOSIS — S92301A Fracture of unspecified metatarsal bone(s), right foot, initial encounter for closed fracture: Secondary | ICD-10-CM | POA: Diagnosis not present

## 2024-07-25 ENCOUNTER — Telehealth: Payer: Self-pay | Admitting: Family Medicine

## 2024-07-25 NOTE — Telephone Encounter (Signed)
 Nurses Patient recently had a ankle fracture Being monitored by orthopedics  Please let patient know based on protocols it is recommended to do an up-to-date bone density If she agrees to do so please set her up for this fall The purpose of this is any older adult especially women who have a fracture should have a up-to-date bone density to make sure they are not developing osteoporosis  Thank you

## 2024-07-31 ENCOUNTER — Other Ambulatory Visit: Payer: Self-pay

## 2024-07-31 DIAGNOSIS — Z13820 Encounter for screening for osteoporosis: Secondary | ICD-10-CM

## 2024-08-02 ENCOUNTER — Ambulatory Visit (HOSPITAL_COMMUNITY)
Admission: RE | Admit: 2024-08-02 | Discharge: 2024-08-02 | Disposition: A | Discharge status: Home / Self Care | Source: Ambulatory Visit | Attending: Family Medicine | Admitting: Family Medicine

## 2024-08-02 ENCOUNTER — Ambulatory Visit: Payer: Self-pay | Admitting: Family Medicine

## 2024-08-02 DIAGNOSIS — Z13820 Encounter for screening for osteoporosis: Secondary | ICD-10-CM | POA: Diagnosis not present

## 2024-08-02 DIAGNOSIS — Z780 Asymptomatic menopausal state: Secondary | ICD-10-CM | POA: Insufficient documentation

## 2024-08-20 DIAGNOSIS — M79671 Pain in right foot: Secondary | ICD-10-CM | POA: Diagnosis not present

## 2024-08-20 DIAGNOSIS — S92352D Displaced fracture of fifth metatarsal bone, left foot, subsequent encounter for fracture with routine healing: Secondary | ICD-10-CM | POA: Diagnosis not present

## 2024-09-12 ENCOUNTER — Ambulatory Visit: Admitting: Family Medicine

## 2024-09-12 ENCOUNTER — Encounter: Payer: Self-pay | Admitting: Family Medicine

## 2024-09-12 VITALS — BP 122/80 | HR 76 | Temp 98.2°F | Ht 64.0 in | Wt 176.2 lb

## 2024-09-12 DIAGNOSIS — G479 Sleep disorder, unspecified: Secondary | ICD-10-CM

## 2024-09-12 DIAGNOSIS — I10 Essential (primary) hypertension: Secondary | ICD-10-CM

## 2024-09-12 DIAGNOSIS — E1169 Type 2 diabetes mellitus with other specified complication: Secondary | ICD-10-CM

## 2024-09-12 DIAGNOSIS — E119 Type 2 diabetes mellitus without complications: Secondary | ICD-10-CM

## 2024-09-12 DIAGNOSIS — E785 Hyperlipidemia, unspecified: Secondary | ICD-10-CM

## 2024-09-12 NOTE — Progress Notes (Signed)
 Acute Office Visit  Subjective:     Patient ID: Beth Mayo, female    DOB: Mar 04, 1951, 73 y.o.   MRN: [id number]    HPI Beth Mayo is being seen today for a follow-up regarding diabetes and hypertension. Taking diabetic medications as prescribed, and is tolerating them well. Reports that she gets yearly eye exams. Patient was walking daily for exercise, but has had to decrease that due to a foot injury back in September. Reports that diet could be better, but patient says that she tries to eat less. Blood pressure is controlled well at home.   Expresses that she has a hard time with sleeping. Said that she wakes up every night around 3 am to 4 am, and struggles to fall back asleep. Reports that her husband does keep the TV on while she sleeps, and she does play games on her phone before bed.   Review of Systems  Respiratory:  Negative for cough, shortness of breath and wheezing.   Cardiovascular:  Negative for chest pain and leg swelling.  Gastrointestinal:  Negative for abdominal pain, constipation, diarrhea and nausea.  Neurological:  Negative for headaches.   Results for orders placed or performed in visit on 07/11/24  HM MAMMOGRAPHY   Collection Time: 05/16/24 12:00 AM  Result Value Ref Range   HM Mammogram 0-4 Bi-Rad 0-4 Bi-Rad, Self Reported Normal       Objective:    Today's Vitals   09/12/24 1059  BP: 122/80  Pulse: 76  Temp: 98.2 F (36.8 C)  SpO2: 98%  Weight: 176 lb 4 oz (79.9 kg)  Height: 5' 4 (1.626 m)   Body mass index is 30.25 kg/m.   Physical Exam Vitals and nursing note reviewed.  Constitutional:      General: She is not in acute distress.    Appearance: Normal appearance. She is not ill-appearing.  Cardiovascular:     Rate and Rhythm: Normal rate and regular rhythm.     Heart sounds: Normal heart sounds, S1 normal and S2 normal. No murmur heard. Pulmonary:     Effort: Pulmonary effort is normal. No respiratory distress.     Breath sounds: Normal  breath sounds. No wheezing.  Chest:     Chest wall: No tenderness.  Skin:    General: Skin is warm and dry.  Neurological:     Mental Status: She is alert. Mental status is at baseline.  Psychiatric:        Mood and Affect: Mood normal.        Behavior: Behavior normal.        Thought Content: Thought content normal.        Judgment: Judgment normal.    Diabetic Foot Exam - Simple   Simple Foot Form Diabetic Foot exam was performed with the following findings: Yes 09/12/2024 11:15 AM  Visual Inspection No deformities, no ulcerations, no other skin breakdown bilaterally: Yes Sensation Testing Intact to touch and monofilament testing bilaterally: Yes Pulse Check Posterior Tibialis and Dorsalis pulse intact bilaterally: Yes Comments        Assessment & Plan:  1. Diabetes mellitus without complication (HCC) (Primary) -Advised to continue taking diabetic medications as prescribed. Educated about continuing to follow a healthy diet with a variety of fruits and vegetables, and participate in exercise as tolerated.  -Completed diabetic foot exam today. Will get yearly eye exam in May 2026.  - Microalbumin / creatinine urine ratio - Hemoglobin A1c - Basic metabolic panel with GFR  2.  Hypertension, unspecified type -Educated patient to continue monitoring blood pressure at home, and to let us  know if blood pressure is over 140/90.   3. Sleep disturbance -Educated patient about proper sleep hygiene, and to avoid screen time before bed. Advised patient to do activities such as reading a book before bedtime, and recommended no caffeine after 3 pm. Recommended a sound machine or fan to help with falling asleep.   #4 hyperlipidemia with diabetes-on rosuvastatin -previous cholesterol looks good-continue current management-Will check that again in 6 months Damien KATHEE Pringle, FNP Also was seen by myself I agree with the management Agree with the test Await the results

## 2024-09-12 NOTE — Progress Notes (Signed)
   Subjective:    Patient ID: Beth Mayo, female    DOB: November 29, 1950, 73 y.o.   MRN: [id number]  HPI Patient is in room 11.  Patient is here for a 6 month follow up.  Patient did not state any concerns.    Review of Systems     Objective:   Physical Exam        Assessment & Plan:

## 2024-10-05 ENCOUNTER — Ambulatory Visit: Payer: Self-pay

## 2024-10-05 LAB — HEMOGLOBIN A1C
Est. average glucose Bld gHb Est-mCnc: 114 mg/dL
Hgb A1c MFr Bld: 5.6 % (ref 4.8–5.6)

## 2024-10-19 NOTE — Progress Notes (Signed)
 "  Acute Office Visit  Subjective:     Patient ID: Beth Mayo, female    DOB: [birth year], 74 y.o.   MRN: [id number]    HPI Beth Mayo is being seen today for a follow-up regarding diabetes and hypertension. Taking diabetic medications as prescribed, and is tolerating them well. Reports that she gets yearly eye exams. Patient was walking daily for exercise, but has had to decrease that due to a foot injury back in September. Reports that diet could be better, but patient says that she tries to eat less. Blood pressure is controlled well at home.   Expresses that she has a hard time with sleeping. Said that she wakes up every night around 3 am to 4 am, and struggles to fall back asleep. Reports that her husband does keep the TV on while she sleeps, and she does play games on her phone before bed.   Review of Systems  Respiratory:  Negative for cough, shortness of breath and wheezing.   Cardiovascular:  Negative for chest pain and leg swelling.  Gastrointestinal:  Negative for abdominal pain, constipation, diarrhea and nausea.  Neurological:  Negative for headaches.   Results for orders placed or performed in visit on 09/12/24  Hemoglobin A1c   Collection Time: 10/04/24  3:02 PM  Result Value Ref Range   Hgb A1c MFr Bld 5.6 4.8 - 5.6 %   Est. average glucose Bld gHb Est-mCnc 114 mg/dL       Objective:    Today's Vitals   09/12/24 1059  BP: 122/80  Pulse: 76  Temp: 98.2 F (36.8 C)  SpO2: 98%  Weight: 176 lb 4 oz (79.9 kg)  Height: 5' 4 (1.626 m)   Body mass index is 30.25 kg/m.   Physical Exam Vitals and nursing note reviewed.  Constitutional:      General: She is not in acute distress.    Appearance: Normal appearance. She is not ill-appearing.  Cardiovascular:     Rate and Rhythm: Normal rate and regular rhythm.     Heart sounds: Normal heart sounds, S1 normal and S2 normal. No murmur heard. Pulmonary:     Effort: Pulmonary effort is normal. No respiratory distress.      Breath sounds: Normal breath sounds. No wheezing.  Chest:     Chest wall: No tenderness.  Skin:    General: Skin is warm and dry.  Neurological:     Mental Status: She is alert. Mental status is at baseline.  Psychiatric:        Mood and Affect: Mood normal.        Behavior: Behavior normal.        Thought Content: Thought content normal.        Judgment: Judgment normal.    Diabetic Foot Exam - Simple   Simple Foot Form Diabetic Foot exam was performed with the following findings: Yes 09/12/2024 11:15 AM  Visual Inspection No deformities, no ulcerations, no other skin breakdown bilaterally: Yes Sensation Testing Intact to touch and monofilament testing bilaterally: Yes Pulse Check Posterior Tibialis and Dorsalis pulse intact bilaterally: Yes Comments        Assessment & Plan:  1. Diabetes mellitus without complication (HCC) (Primary) -Advised to continue taking diabetic medications as prescribed. Educated about continuing to follow a healthy diet with a variety of fruits and vegetables, and participate in exercise as tolerated.  -Completed diabetic foot exam today. Will get yearly eye exam in May 2026.  - Microalbumin / creatinine urine  ratio - Hemoglobin A1c - Basic metabolic panel with GFR  2. Hypertension, unspecified type -Educated patient to continue monitoring blood pressure at home, and to let us  know if blood pressure is over 140/90.   3. Sleep disturbance -Educated patient about proper sleep hygiene, and to avoid screen time before bed. Advised patient to do activities such as reading a book before bedtime, and recommended no caffeine after 3 pm. Recommended a sound machine or fan to help with falling asleep.   #4 hyperlipidemia with diabetes-on rosuvastatin -previous cholesterol looks good-continue current management-Will check that again in 6 months Damien KATHEE Pringle, FNP Also was seen by myself I agree with the management Agree with the test Await the  results  Addendum:  Patient was seen at [facility] on 09/12/2024 under the supervision of Glendia Fielding, MD. Originally entered in error.   Thanks, Damien Pringle, FNP-BC "

## 2025-03-13 ENCOUNTER — Ambulatory Visit: Admitting: Family Medicine

## 2025-07-05 ENCOUNTER — Ambulatory Visit
